# Patient Record
Sex: Male | Born: 1955 | Race: White | Hispanic: No | Marital: Married | Smoking: Former smoker
Health system: Southern US, Community
[De-identification: ages and names within clinical notes are randomized; demographics above are authoritative.]

## PROBLEM LIST (undated history)

## (undated) DIAGNOSIS — I10 Essential (primary) hypertension: Secondary | ICD-10-CM

## (undated) DIAGNOSIS — E785 Hyperlipidemia, unspecified: Secondary | ICD-10-CM

## (undated) DIAGNOSIS — T7840XA Allergy, unspecified, initial encounter: Secondary | ICD-10-CM

## (undated) DIAGNOSIS — F411 Generalized anxiety disorder: Secondary | ICD-10-CM

## (undated) DIAGNOSIS — G4730 Sleep apnea, unspecified: Secondary | ICD-10-CM

## (undated) DIAGNOSIS — K219 Gastro-esophageal reflux disease without esophagitis: Secondary | ICD-10-CM

## (undated) DIAGNOSIS — Q282 Arteriovenous malformation of cerebral vessels: Secondary | ICD-10-CM

## (undated) HISTORY — DX: Gastro-esophageal reflux disease without esophagitis: K21.9

## (undated) HISTORY — DX: Hyperlipidemia, unspecified: E78.5

## (undated) HISTORY — DX: Generalized anxiety disorder: F41.1

## (undated) HISTORY — DX: Allergy, unspecified, initial encounter: T78.40XA

## (undated) HISTORY — DX: Essential (primary) hypertension: I10

## (undated) HISTORY — DX: Sleep apnea, unspecified: G47.30

## (undated) HISTORY — PX: WISDOM TOOTH EXTRACTION: SHX21

---

## 1996-09-03 HISTORY — PX: HEMORRHOID SURGERY: SHX153

## 1998-12-21 ENCOUNTER — Encounter: Payer: Self-pay | Admitting: Emergency Medicine

## 1998-12-21 ENCOUNTER — Emergency Department (HOSPITAL_COMMUNITY): Admission: EM | Admit: 1998-12-21 | Discharge: 1998-12-21 | Payer: Self-pay | Admitting: Emergency Medicine

## 2000-02-01 ENCOUNTER — Ambulatory Visit (HOSPITAL_COMMUNITY): Admission: RE | Admit: 2000-02-01 | Discharge: 2000-02-01 | Payer: Self-pay | Admitting: Internal Medicine

## 2000-02-01 ENCOUNTER — Encounter: Payer: Self-pay | Admitting: Internal Medicine

## 2004-10-20 ENCOUNTER — Ambulatory Visit: Payer: Self-pay | Admitting: Internal Medicine

## 2004-10-27 ENCOUNTER — Ambulatory Visit: Payer: Self-pay | Admitting: Internal Medicine

## 2004-11-07 ENCOUNTER — Ambulatory Visit: Payer: Self-pay | Admitting: Gastroenterology

## 2004-11-22 ENCOUNTER — Ambulatory Visit: Payer: Self-pay | Admitting: Gastroenterology

## 2004-11-22 HISTORY — PX: COLONOSCOPY: SHX174

## 2004-11-22 LAB — HM COLONOSCOPY

## 2005-01-23 ENCOUNTER — Ambulatory Visit: Payer: Self-pay | Admitting: Gastroenterology

## 2005-01-30 ENCOUNTER — Ambulatory Visit: Payer: Self-pay | Admitting: Gastroenterology

## 2005-04-09 ENCOUNTER — Ambulatory Visit: Payer: Self-pay | Admitting: Internal Medicine

## 2005-09-18 ENCOUNTER — Ambulatory Visit: Payer: Self-pay | Admitting: Internal Medicine

## 2005-10-11 ENCOUNTER — Ambulatory Visit: Payer: Self-pay | Admitting: Internal Medicine

## 2005-10-23 ENCOUNTER — Ambulatory Visit: Payer: Self-pay | Admitting: Internal Medicine

## 2005-11-01 ENCOUNTER — Ambulatory Visit: Payer: Self-pay | Admitting: Internal Medicine

## 2005-12-20 ENCOUNTER — Ambulatory Visit: Payer: Self-pay | Admitting: Endocrinology

## 2006-07-24 ENCOUNTER — Ambulatory Visit: Payer: Self-pay | Admitting: Internal Medicine

## 2006-08-02 ENCOUNTER — Ambulatory Visit: Payer: Self-pay | Admitting: Internal Medicine

## 2006-10-22 ENCOUNTER — Ambulatory Visit: Payer: Self-pay | Admitting: Internal Medicine

## 2006-11-11 ENCOUNTER — Ambulatory Visit: Payer: Self-pay | Admitting: Internal Medicine

## 2007-05-08 DIAGNOSIS — E785 Hyperlipidemia, unspecified: Secondary | ICD-10-CM

## 2007-05-08 DIAGNOSIS — K219 Gastro-esophageal reflux disease without esophagitis: Secondary | ICD-10-CM

## 2007-05-08 DIAGNOSIS — I10 Essential (primary) hypertension: Secondary | ICD-10-CM | POA: Insufficient documentation

## 2007-07-03 ENCOUNTER — Ambulatory Visit: Payer: Self-pay | Admitting: Internal Medicine

## 2007-09-18 ENCOUNTER — Ambulatory Visit: Payer: Self-pay | Admitting: Internal Medicine

## 2007-09-18 LAB — CONVERTED CEMR LAB
ALT: 50 units/L (ref 0–53)
AST: 30 units/L (ref 0–37)
BUN: 13 mg/dL (ref 6–23)
CO2: 33 meq/L — ABNORMAL HIGH (ref 19–32)
Calcium: 9.3 mg/dL (ref 8.4–10.5)
Chloride: 102 meq/L (ref 96–112)
Cholesterol: 193 mg/dL (ref 0–200)
Creatinine, Ser: 0.9 mg/dL (ref 0.4–1.5)
GFR calc Af Amer: 115 mL/min
GFR calc non Af Amer: 95 mL/min
Glucose, Bld: 108 mg/dL — ABNORMAL HIGH (ref 70–99)
HDL: 42.8 mg/dL (ref >39.0)
LDL Cholesterol: 123 mg/dL — ABNORMAL HIGH (ref 0–99)
Potassium: 3.9 meq/L (ref 3.5–5.1)
Sodium: 143 meq/L (ref 135–145)
Total CHOL/HDL Ratio: 4.5
Triglycerides: 134 mg/dL (ref 0–149)
VLDL: 27 mg/dL (ref 0–40)

## 2007-09-19 ENCOUNTER — Encounter: Payer: Self-pay | Admitting: Internal Medicine

## 2008-06-07 ENCOUNTER — Telehealth: Payer: Self-pay | Admitting: Internal Medicine

## 2008-06-10 ENCOUNTER — Encounter: Payer: Self-pay | Admitting: Internal Medicine

## 2008-08-02 ENCOUNTER — Ambulatory Visit: Payer: Self-pay | Admitting: Internal Medicine

## 2008-08-02 DIAGNOSIS — F411 Generalized anxiety disorder: Secondary | ICD-10-CM | POA: Insufficient documentation

## 2008-11-26 ENCOUNTER — Encounter: Payer: Self-pay | Admitting: Internal Medicine

## 2009-06-08 ENCOUNTER — Telehealth: Payer: Self-pay | Admitting: Internal Medicine

## 2009-09-12 ENCOUNTER — Telehealth: Payer: Self-pay | Admitting: Internal Medicine

## 2009-10-06 ENCOUNTER — Ambulatory Visit: Payer: Self-pay | Admitting: Internal Medicine

## 2009-10-06 LAB — CONVERTED CEMR LAB
ALT: 39 units/L (ref 0–53)
AST: 24 units/L (ref 0–37)
Albumin: 4.2 g/dL (ref 3.5–5.2)
Alkaline Phosphatase: 55 units/L (ref 39–117)
Bilirubin, Direct: 0.1 mg/dL (ref 0.0–0.3)
Total Bilirubin: 0.6 mg/dL (ref 0.3–1.2)
Total Protein: 7.2 g/dL (ref 6.0–8.3)

## 2009-10-10 ENCOUNTER — Encounter: Admission: RE | Admit: 2009-10-10 | Discharge: 2009-10-10 | Payer: Self-pay | Admitting: Internal Medicine

## 2009-10-11 ENCOUNTER — Ambulatory Visit: Payer: Self-pay | Admitting: Gastroenterology

## 2009-10-24 ENCOUNTER — Ambulatory Visit (HOSPITAL_COMMUNITY): Admission: RE | Admit: 2009-10-24 | Discharge: 2009-10-24 | Payer: Self-pay | Admitting: Gastroenterology

## 2009-10-28 ENCOUNTER — Ambulatory Visit: Payer: Self-pay | Admitting: Gastroenterology

## 2010-01-31 ENCOUNTER — Ambulatory Visit: Payer: Self-pay | Admitting: Internal Medicine

## 2010-06-02 ENCOUNTER — Telehealth: Payer: Self-pay | Admitting: Internal Medicine

## 2010-06-02 ENCOUNTER — Ambulatory Visit: Payer: Self-pay | Admitting: Internal Medicine

## 2010-06-02 LAB — CONVERTED CEMR LAB
Bilirubin Urine: NEGATIVE
Hemoglobin, Urine: NEGATIVE
Ketones, ur: NEGATIVE mg/dL
Leukocytes, UA: NEGATIVE
Nitrite: NEGATIVE
Specific Gravity, Urine: 1.015 (ref 1.000–1.030)
Total Protein, Urine: NEGATIVE mg/dL
Urine Glucose: NEGATIVE mg/dL
Urobilinogen, UA: 0.2 (ref 0.0–1.0)
pH: 6.5 (ref 5.0–8.0)

## 2010-06-05 ENCOUNTER — Ambulatory Visit: Payer: Self-pay | Admitting: Internal Medicine

## 2010-06-05 DIAGNOSIS — R1011 Right upper quadrant pain: Secondary | ICD-10-CM | POA: Insufficient documentation

## 2010-10-03 NOTE — Progress Notes (Signed)
  Phone Note Refill Request Message from:  Fax from Pharmacy on September 12, 2009 9:44 AM  Refills Requested: Medication #1:  OMEPRAZOLE 20 MG CPDR Take 2 tablet by mouth every morning  Medication #2:  DIOVAN HCT 80-12.5 MG  TABS Take 1 tablet by mouth once a day Initial call taken by: Ami Bullins CMA,  September 12, 2009 9:45 AM    Prescriptions: DIOVAN HCT 80-12.5 MG  TABS (VALSARTAN-HYDROCHLOROTHIAZIDE) Take 1 tablet by mouth once a day  #90 x 3   Entered by:   Ami Bullins CMA   Authorized by:   Jacques Navy MD   Signed by:   Bill Salinas CMA on 09/12/2009   Method used:   Electronically to        [facility]* (mail-order)             ,          Ph: [phone]       Fax: [phone]   RxID:   [id number] OMEPRAZOLE 20 MG CPDR (OMEPRAZOLE) Take 2 tablet by mouth every morning  #180 x 3   Entered by:   Ami Bullins CMA   Authorized by:   Jacques Navy MD   Signed by:   Bill Salinas CMA on 09/12/2009   Method used:   Electronically to        [facility]* (mail-order)             ,          Ph: [phone]       Fax: [phone]   RxID:   [id number]

## 2010-10-03 NOTE — Procedures (Signed)
Summary: EGD   EGD  Procedure date:  11/22/2004  Findings:      Location: [facility]   Patient Name: Michael Shepard, Michael Shepard. MRN:  Procedure Procedures: Panendoscopy (EGD) CPT: 43235.    with biopsy(s)/brushing(s). CPT: D1846139.  Personnel: Endoscopist: Vania Rea. Jarold Motto, MD.  Exam Location: Exam performed in Outpatient Clinic. Outpatient  Patient Consent: Procedure, Alternatives, Risks and Benefits discussed, consent obtained, from patient. Consent was obtained by the RN.  Indications Symptoms: Reflux symptoms for 6-10 yrs, occurring 3-6 times/wk.  History  Current Medications: Patient is not currently taking Coumadin.  Pre-Exam Physical: Performed Nov 22, 2004  Cardio-pulmonary exam, Abdominal exam, Extremity exam, Mental status exam WNL.  Exam Exam Info: Maximum depth of insertion Duodenum, intended Duodenum. Patient position: on left side. Duration of exam: 15 minutes. Vocal cords visualized. Gastric retroflexion performed. Images taken. ASA Classification: I. Tolerance: fair, adequate exam.  Sedation Meds: Patient assessed and found to be appropriate for moderate (conscious) sedation. Cetacaine Spray 2 sprays given aerosolized.  Monitoring: BP and pulse monitoring done. Oximetry used. Supplemental O2 given at 2 Liters.  Instrument(s): GIF 160. Serial [id number].   Comments: Severe gagging and retching throughout the procedure. Findings - HIATAL HERNIA: Prolapsing, 10 cms. in length. ICD9: Hernia, Hiatal: 553.3.Comments: Very large hernia and linear erosions in the hernia sac noted. .  - OTHER FINDING: in Distal Esophagus. Biopsy/Other Finding taken. Comments: Slightly irregular Z-line biopsied..R/O Barrett's mucosa.  - Normal: Body to Duodenal 2nd Portion. Not Seen: Mucosal abnormality. AVM's. Foreign body. Comments: Very large prominant rugal folds noted.   Assessment  Diagnoses: 553.3: Hernia, Hiatal.   Comments: Chronic GERD.Marland KitchenMarland KitchenR/O  Barrett's mucosa.Marland KitchenMarland KitchenVery Large HH noted !!!!!! Events  Unplanned Intervention: No unplanned interventions were required.  Plans Medication(s): Await pathology. Continue current medications.  Patient Education: Patient given standard instructions for: Hiatal Hernia. Reflux.  Disposition: After procedure patient sent to recovery. After recovery patient sent home.  Scheduling: Clinic Visit, to Vania Rea. Jarold Motto, MD, around Dec 23, 2004.    cc: Jefferey Pica, MD  This report was created from the original endoscopy report, which was reviewed and signed by the above listed endoscopist.

## 2010-10-03 NOTE — Assessment & Plan Note (Signed)
Summary: Follow up/dfs   History of Present Illness Visit Type: Follow-up Visit Primary GI MD: Sheryn Bison MD FACP FAGA Primary Provider: Jacques Navy MD Requesting Provider: n/a Chief Complaint: follow up RUQ pain, pt states pain is 90% better History of Present Illness:    His CCK/HIDA scan was normal. He does have known gallbladder polyps. He currently is asymptomatic. He specifically denies bowel or irregularity, reflux symptoms, anorexia, systemic complaints, or any hepatobiliary problems   GI Review of Systems    Reports abdominal pain.     Location of  Abdominal pain: RUQ.    Denies acid reflux, belching, bloating, chest pain, dysphagia with liquids, dysphagia with solids, heartburn, loss of appetite, nausea, vomiting, vomiting blood, weight loss, and  weight gain.        Denies anal fissure, black tarry stools, change in bowel habit, constipation, diarrhea, diverticulosis, fecal incontinence, heme positive stool, hemorrhoids, irritable bowel syndrome, jaundice, light color stool, liver problems, rectal bleeding, and  rectal pain.    Current Medications (verified): 1)  Omeprazole 20 Mg Cpdr (Omeprazole) .... Take 2 Tablet By Mouth Every Morning 2)  Simvastatin 40 Mg Tabs (Simvastatin) .... Take 1 Tablet By Mouth Every Morning 3)  Diovan Hct 80-12.5 Mg  Tabs (Valsartan-Hydrochlorothiazide) .... Take 1 Tablet By Mouth Once A Day  Allergies (verified): 1)  ! Penicillin 2)  ! Erythromycin  Past History:  Past medical, surgical, family and social histories (including risk factors) reviewed for relevance to current acute and chronic problems.  Past Medical History: Reviewed history from 08/02/2008 and no changes required.  GENERALIZED ANXIETY DISORDER (ICD-300.02) FAMILY HISTORY DIABETES 1ST DEGREE RELATIVE (ICD-V18.0) HYPERLIPIDEMIA (ICD-272.4) HYPERTENSION (ICD-401.9) GERD (ICD-530.81)  Past Surgical History: Reviewed history from 05/08/2007 and no changes  required. Hemorrhoidectomy-1998 Wisdom tooth extraction- as child  Family History: Reviewed history from 08/02/2008 and no changes required. father- deceased @70 : c/o DM mother - 1935: generally good health. Survived ovarian cancer with surgical cure Family History Diabetes 1st degree relative Neg - prostate or colon cancer MGF - CAD/MI  Social History: Reviewed history from 08/02/2008 and no changes required. 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  Review of Systems  The patient denies allergy/sinus, anemia, anxiety-new, arthritis/joint pain, back pain, blood in urine, breast changes/lumps, confusion, cough, coughing up blood, depression-new, fainting, fatigue, fever, headaches-new, hearing problems, heart murmur, heart rhythm changes, itching, muscle pains/cramps, night sweats, nosebleeds, shortness of breath, skin rash, sleeping problems, sore throat, swelling of feet/legs, swollen lymph glands, thirst - excessive, urination - excessive, urination changes/pain, urine leakage, vision changes, and voice change.    Vital Signs:  Patient profile:   55 year old male Height:      70.5 inches Weight:      180 pounds BMI:     25.55 Pulse rate:   76 / minute Pulse rhythm:   regular BP sitting:   130 / 80  (left arm) Cuff size:   regular  Vitals Entered By: Francee Piccolo CMA Duncan Dull) (October 28, 2009 8:44 AM)  Physical Exam  General:  Well developed, well nourished, no acute distress.healthy appearing.   Head:  Normocephalic and atraumatic. Eyes:  PERRLA, no icterus.exam deferred to patient's ophthalmologist.   Abdomen:  Soft, nontender and nondistended. No masses, hepatosplenomegaly or hernias noted. Normal bowel sounds. Psych:  Alert and cooperative. Normal mood and affect.   Impression & Recommendations:  Problem # 1:  ABDOMINAL PAIN,  RIGHT UPPER QUADRANT (ICD-789.01) Assessment  Improved Probable micro-lithiasis of his gallbladder despite negative workup to date. We will follow him symptomatically and see him acutely she have recurrence of his pain. His liver function tests were normal and previous colonoscopy was unremarkable. We will continue his reflux regime and daily omeprazole for his history of GERD.  Problem # 2:  HYPERTENSION (ICD-401.9) Assessment: Improved blood pressure today is 130/80 and advised to continue his Diovan/HCT and simvastatin.  Patient Instructions: 1)  Copy sent to : Dr. Illene Regulus 2)  Please continue current medications.  3)  Please schedule a follow-up appointment as needed.  4)  The medication list was reviewed and reconciled.  All changed / newly prescribed medications were explained.  A complete medication list was provided to the patient / caregiver.

## 2010-10-03 NOTE — Progress Notes (Signed)
Summary: RX? - U/A   Phone Note Call from Patient Call back at Home Phone [phone]   Summary of Call: Pt was treated in May for UTI. He is c/o same symptoms and "kidney" pain. Will MD call in rx? Or does he need office visit?  Initial call taken by: Lamar Sprinkles, CMA,  June 02, 2010 10:14 AM  Follow-up for Phone Call        Per MD, Pt needs u/a. Pt informed, Order in idx. HOLD PHONE NOTE OPEN TO WAIT ON RESULTS.  Follow-up by: Lamar Sprinkles, CMA,  June 02, 2010 12:45 PM  Additional Follow-up for Phone Call Additional follow up Details #1::        Results ready in EMR Additional Follow-up by: Lamar Sprinkles, CMA,  June 02, 2010 3:32 PM    Additional Follow-up for Phone Call Additional follow up Details #2::    U/A negative - no evidence of infection: no antibiotics needed.  Follow-up by: Jacques Navy MD,  June 02, 2010 4:00 PM  Additional Follow-up for Phone Call Additional follow up Details #3:: Details for Additional Follow-up Action Taken: Left detailed vm to call for sat clinic office visit if continues to have symptoms - u/a neg Additional Follow-up by: Lamar Sprinkles, CMA,  June 02, 2010 4:52 PM

## 2010-10-03 NOTE — Assessment & Plan Note (Signed)
Summary: kidney pain-lb   Vital Signs:  Patient profile:   55 year old male Height:      70.5 inches Weight:      177 pounds BMI:     25.13 O2 Sat:      97 % on Room air Temp:     97.0 degrees F oral Pulse rate:   72 / minute BP sitting:   120 / 64  (left arm) Cuff size:   regular  Vitals Entered By: Bill Salinas CMA (June 05, 2010 9:07 AM)  O2 Flow:  Room air CC: pt here for evaluation of previous symptoms of urinary discomfort and abd pain. Pt states symptoms started last week and have since resolved/ ab Comments PT will get flu shot today and is due tor tetanus/ ab   Primary Care Provider:  Jacques Navy MD  CC:  pt here for evaluation of previous symptoms of urinary discomfort and abd pain. Pt states symptoms started last week and have since resolved/ ab.  History of Present Illness: Patient presents after developing lower abdominal/suprapubic pain last week along with bladder pressure, left flank pain and perineal pain. On further questioning he does admit to perineal pain. He was concerned for bladder infection - U/A in the office last Friday was negative. He reports that his symptoms resolved so that Saturday he was pain free.  He has had intermittent RUQ pain. He had a full work up in February '11 with U/S revealing gallbladder polyps; hepato-biliary scan was normal.  He reports that he is otherwise feeling better than he did 10 years ago.   Current Medications (verified): 1)  Omeprazole 20 Mg Cpdr (Omeprazole) .... Take 2 Tablet By Mouth Every Morning 2)  Simvastatin 40 Mg Tabs (Simvastatin) .... Take 1 Tablet By Mouth Every Morning 3)  Diovan Hct 80-12.5 Mg  Tabs (Valsartan-Hydrochlorothiazide) .... Take 1 Tablet By Mouth Once A Day  Allergies (verified): 1)  ! Penicillin 2)  ! Erythromycin  Past History:  Past Medical History: Last updated: 09/01/2008  GENERALIZED ANXIETY DISORDER (ICD-300.02) FAMILY HISTORY DIABETES 1ST DEGREE RELATIVE  (ICD-V18.0) HYPERLIPIDEMIA (ICD-272.4) HYPERTENSION (ICD-401.9) GERD (ICD-530.81)  Past Surgical History: Last updated: 05/08/2007 Hemorrhoidectomy-1998 Wisdom tooth extraction- as child  Family History: Last updated: 2008/09/01 father- deceased @70 : c/o DM mother - 1935: generally good health. 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  Review of Systems  The patient denies anorexia, fever, weight loss, weight gain, vision loss, decreased hearing, chest pain, dyspnea on exertion, peripheral edema, abdominal pain, severe indigestion/heartburn, muscle weakness, suspicious skin lesions, depression, abnormal bleeding, and enlarged lymph nodes.    Physical Exam  General:  Well-developed,well-nourished,in no acute distress; alert,appropriate and cooperative throughout examination Head:  normocephalic and atraumatic.  normocephalic and atraumatic.   Eyes:  vision grossly intact, pupils equal, and pupils round.  vision grossly intact, pupils equal, and pupils round.   Neck:  supple.  supple.   Lungs:  normal respiratory effort and normal breath sounds.  normal respiratory effort and normal breath sounds.   Heart:  normal rate and regular rhythm.  normal rate and regular rhythm.   Abdomen:  non-tender.  non-tender.   Pulses:  2+ radial Neurologic:  alert & oriented X3, cranial nerves II-XII intact, and gait  normal.  alert & oriented X3, cranial nerves II-XII intact, and gait normal.   Skin:  turgor normal and color normal.  turgor normal and color normal.   Psych:  Oriented X3, normally interactive, good eye contact, and not anxious appearing.  Oriented X3, normally interactive, good eye contact, and not anxious appearing.      Impression & Recommendations:  Problem # 1:  HYPERTENSION (ICD-401.9)  His updated medication list for this problem includes:    Diovan Hct 80-12.5 Mg Tabs (Valsartan-hydrochlorothiazide) .Marland Kitchen... Take 1 tablet by mouth once a day  BP today: 120/64 Prior BP: 134/72 (01/31/2010)  Labs Reviewed: K+: 3.9 (09/18/2007) Creat: : 0.9 (09/18/2007)   Chol: 193 (09/18/2007)   HDL: 42.8 (09/18/2007)   LDL: 123 (09/18/2007)   TG: 134 (09/18/2007)  Great control. continue present meds.  Problem # 2:  PROSTATITIS, MILD (ICD-601.9) Patients symptoms are suggestive of mild prostatitis of viral origin with spontaneous resolution.  Problem # 3:  ABDOMINAL PAIN, RIGHT UPPER QUADRANT (ICD-789.01) continued intermittent pain. suspect that he may of intermittent Cystic duct obstruction from polyp seen on u/s.  Plan - continued observation. For persistent problems may need to consider surgical consult.   Complete Medication List: 1)  Omeprazole 20 Mg Cpdr (Omeprazole) .... Take 2 tablet by mouth every morning 2)  Simvastatin 40 Mg Tabs (Simvastatin) .... Take 1 tablet by mouth every morning 3)  Diovan Hct 80-12.5 Mg Tabs (Valsartan-hydrochlorothiazide) .... Take 1 tablet by mouth once a day  Other Orders: Admin 1st Vaccine (16109) Flu Vaccine 86yrs + (60454) Benadryl  IM or IV (J1200) Admin of Therapeutic Inj  intramuscular or subcutaneous (09811) Flu Vaccine Consent Questions     Do you have a history of severe allergic reactions to this vaccine? no    Any prior history of allergic reactions to egg and/or gelatin? no    Do you have a sensitivity to the preservative Thimersol? no    Do you have a past history of Guillan-Barre Syndrome? no    Do you currently have an acute febrile illness? no    Have you ever had a severe reaction to latex? no    Vaccine information given and explained to patient? yes    Are you currently pregnant? no    Lot Number:[id number]   Exp Date:03/03/2011   Site Given   Left Deltoid IM   Medication Administration  Injection # 1:    Medication: Benadryl  IM or IV    Diagnosis: ROUTINE GENERAL MEDICAL EXAM@HEALTH  CARE FACL (ICD-V70.0)    Route: IM    Site: R deltoid    Exp Date: 10/13    Lot #: [id number]    Mfr: GlaxoSmithKline  Orders Added: 1)  Admin 1st Vaccine [90471] 2)  Flu Vaccine 63yrs + [13086] 3)  Benadryl  IM or IV [J1200] 4)  Admin of Therapeutic Inj  intramuscular or subcutaneous [96372] 5)  Est. Patient Level III [57846] .lbflu    Appended Document: kidney pain-lb     Clinical Lists Changes  Orders: Added new Service order of Admin 1st Vaccine (96295) - Signed Added new Service order of Flu Vaccine 55yrs + [phone]) - Signed Observations: Added new observation of FLU VAX VIS: 03/28/10 version (06/05/2010 10:46) Added new observation of FLU VAXLOT: [id number] (06/05/2010 10:46) Added new observation of FLU VAXMFR: Glaxosmithkline (06/05/2010 10:46) Added new observation of FLU VAX EXP: 03/03/2011 (06/05/2010 10:46) Added new observation of FLU VAX DSE: 0.23ml (06/05/2010 10:46) Added new observation of FLU VAX:  Fluvax 3+ (06/05/2010 10:46)Flu Vaccine Consent Questions     Do you have a history of severe allergic reactions to this vaccine? no    Any prior history of allergic reactions to egg and/or gelatin? no    Do you have a sensitivity to the preservative Thimersol? no    Do you have a past history of Guillan-Barre Syndrome? no    Do you currently have an acute febrile illness? no    Have you ever had a severe reaction to latex? no    Vaccine information given and explained to patient? yes    Are you currently pregnant? no    Lot Number:[id number]   Exp Date:03/03/2011   Site Given  Left Deltoid IMw observation of FLU VAX EXP: 03/03/2011 (06/05/2010 10:46) Added new observation of FLU VAX DSE: 0.25ml (06/05/2010 10:46) Added new observation of FLU VAX: Fluvax 3+ (06/05/2010 10:46)    .lbflu

## 2010-10-03 NOTE — Procedures (Signed)
Summary: COLON   Colonoscopy  Procedure date:  11/22/2004  Findings:      Location:  [facility].   Patient Name: Michael Shepard, Michael Shepard MRN:  Procedure Procedures: Colonoscopy CPT: 09323.  Personnel: Endoscopist: Vania Rea. Jarold Motto, MD.  Exam Location: Exam performed in Outpatient Clinic. Outpatient  Patient Consent: Procedure, Alternatives, Risks and Benefits discussed, consent obtained, from patient. Consent was obtained by the RN.  Indications Symptoms: Abdominal pain / bloating.  History  Current Medications: Patient is not currently taking Coumadin.  Pre-Exam Physical: Performed Nov 22, 2004. Cardio-pulmonary exam, Rectal exam, Abdominal exam, Extremity exam, Mental status exam WNL.  Exam Exam: Extent of exam reached: Cecum, extent intended: Cecum.  The cecum was identified by appendiceal orifice and IC valve. Patient position: on left side. Duration of exam: 20 minutes. Colon retroflexion performed. Images taken. ASA Classification: I. Tolerance: excellent.  Monitoring: Pulse and BP monitoring, Oximetry used. Supplemental O2 given. at 2 Liters.  Colon Prep Used Golytely for colon prep. Prep results: excellent.  Sedation Meds: Patient assessed and found to be appropriate for moderate (conscious) sedation. Fentanyl 50 mcg. given IV. Versed 5 mg. given IV.  Instrument(s): CF 140L. Serial [id number].  Findings - DIVERTICULOSIS: Descending Colon to Sigmoid Colon. Not bleeding. ICD9: Diverticulosis, Colon: 562.10. Comments: A few scattered tics noted...no inflammation noted.  - NORMAL EXAM: Cecum to Rectum. Not Seen: Polyps. AVM's. Colitis. Tumors. Crohn's.   Assessment  Diagnoses: 562.10: Diverticulosis, Colon.   Events  Unplanned Interventions: No intervention was required.  Plans Medication Plan: Fiber supplements: Methylcellulose 1 tsp QAM, starting Nov 22, 2004 for indefinitely.   Patient Education: Patient given standard  instructions for: Diverticulosis. high fiber diet.  Disposition: After procedure patient sent to recovery. After recovery patient sent home.  Scheduling/Referral: Follow-Up prn.    cc: Jefferey Pica, MD  This report was created from the original endoscopy report, which was reviewed and signed by the above listed endoscopist.

## 2010-10-03 NOTE — Assessment & Plan Note (Signed)
Summary: STOMACH PAIN FOR 3 WKS /NWS   Vital Signs:  Patient profile:   55 year old male Height:      71 inches Weight:      177 pounds BMI:     24.78 O2 Sat:      97 % on Room air Temp:     98.0 degrees F oral Pulse rate:   95 / minute BP sitting:   142 / 68  (left arm) Cuff size:   regular  Vitals Entered By: Bill Salinas CMA (October 06, 2009 9:10 AM)  O2 Flow:  Room air CC: pt here with c/o abd pain x 3 weeks with occasional diarrhea. Pt states pain is mainly right under his rib cage/ pt denies any blood in stools and states last colonoscopy was in 2007/ ab   Primary Care Provider:  Jacques Navy MD  CC:  pt here with c/o abd pain x 3 weeks with occasional diarrhea. Pt states pain is mainly right under his rib cage/ pt denies any blood in stools and states last colonoscopy was in 2007/ ab.  History of Present Illness: 3 week h/o RUQ abdominal pain that is a clenched knife type pain. No radiation to the back. No relationship to foods. No change in bowel habit - intermittent diarrhea tht has been chronic. No light colored stools. No urination problems. No fevers or chills. Occasional mild nausea.   Old records searched ( see scanned not from '07): last abdominal U/S with gallbladder polyp but no other abnormality  Current Medications (verified): 1)  Omeprazole 20 Mg Cpdr (Omeprazole) .... Take 2 Tablet By Mouth Every Morning 2)  Simvastatin 40 Mg Tabs (Simvastatin) .... Take 1 Tablet By Mouth Every Morning 3)  Diovan Hct 80-12.5 Mg  Tabs (Valsartan-Hydrochlorothiazide) .... Take 1 Tablet By Mouth Once A Day  Allergies (verified): 1)  ! Penicillin 2)  ! Erythromycin  Past History:  Past Medical History: Last updated: 2008/08/27  GENERALIZED ANXIETY DISORDER (ICD-300.02) FAMILY HISTORY DIABETES 1ST DEGREE RELATIVE (ICD-V18.0) HYPERLIPIDEMIA (ICD-272.4) HYPERTENSION (ICD-401.9) GERD (ICD-530.81)  Past Surgical History: Last updated:  05/08/2007 Hemorrhoidectomy-1998 Wisdom tooth extraction- as child  Family History: Last updated: Aug 27, 2008 father- deceased @70 : c/o DM mother - 1935: generally good health. 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  Risk Factors: Alcohol Use: 1 (2008-08-27) Caffeine Use: 2 (08/27/2008) Exercise: yes (Aug 27, 2008)  Risk Factors: Smoking Status: quit (08/27/2008) Passive Smoke Exposure: no (27-Aug-2008)  Review of Systems  The patient denies anorexia, fever, weight loss, weight gain, hoarseness, chest pain, prolonged cough, abdominal pain, severe indigestion/heartburn, genital sores, transient blindness, difficulty walking, unusual weight change, and enlarged lymph nodes.    Physical Exam  General:  WNWD white male in no distress Head:  Normocephalic and atraumatic without obvious abnormalities. No apparent alopecia or balding. Eyes:  sclera clear Neck:  supple and full ROM.   Lungs:  normal respiratory effort and normal breath sounds.   Heart:  normal rate and regular rhythm.   Abdomen:  soft, non-tender, normal bowel sounds, no distention, no masses, no guarding, no rigidity, and no hepatomegaly.  No tenderness to deep palpation  RUQ, no GB bulb Neurologic:  alert & oriented X3, cranial nerves II-XII intact, strength normal in all extremities, and gait  normal.   Skin:  turgor normal, color normal, and no rashes.   Psych:  Oriented X3 and good eye contact.     Impression & Recommendations:  Problem # 1:  ABDOMINAL PAIN, RIGHT UPPER QUADRANT (ICD-789.01) Recurrent problem. At last eval no cause found. He is very functional and has no limitations in his activities.  Plan - Abdominal U/S - evaluate for GB  changes or hepatic abnormalities           Lab - LFT's r/o fatty infiltrate liver or other hepatitis.           if lab and imaging are unrevealing will refer to GI  Orders: TLB-Hepatic/Liver Function Pnl ([id number]-[id number]) Radiology Referral (Radiology) Gastroenterology Referral (GI)  addendum - LFT's normal  Complete Medication List: 1)  Omeprazole 20 Mg Cpdr (Omeprazole) .... Take 2 tablet by mouth every morning 2)  Simvastatin 40 Mg Tabs (Simvastatin) .... Take 1 tablet by mouth every morning 3)  Diovan Hct 80-12.5 Mg Tabs (Valsartan-hydrochlorothiazide) .... Take 1 tablet by mouth once a day   Immunization History:  Influenza Immunization History:    Influenza:  historical (07/08/2009)

## 2010-10-03 NOTE — Assessment & Plan Note (Signed)
Summary: PROSTATITIS--STC   Vital Signs:  Patient profile:   55 year old male Height:      70.5 inches Weight:      179 pounds BMI:     25.41 O2 Sat:      98 % on Room air Temp:     97.0 degrees F oral Pulse rate:   76 / minute BP sitting:   134 / 72  (left arm) Cuff size:   regular  Vitals Entered By: Bill Salinas CMA (Jan 31, 2010 9:49 AM)  O2 Flow:  Room air CC: pt here with c/o having burning sensation when urinating but states burning sensation constant just more noticable when he urinates/ ab   Primary Care Provider:  Jacques Navy MD  CC:  pt here with c/o having burning sensation when urinating but states burning sensation constant just more noticable when he urinates/ ab.  History of Present Illness: Having lower abdominal pain/pressure, pressure with urination. He did have a fever that did not last long.  No low back pain. Mild perineal pain. Mild dysuria. No pain with ejactulation. He has had prostatitis in the past ('90's) and this feels the same.   Current Medications (verified): 1)  Omeprazole 20 Mg Cpdr (Omeprazole) .... Take 2 Tablet By Mouth Every Morning 2)  Simvastatin 40 Mg Tabs (Simvastatin) .... Take 1 Tablet By Mouth Every Morning 3)  Diovan Hct 80-12.5 Mg  Tabs (Valsartan-Hydrochlorothiazide) .... Take 1 Tablet By Mouth Once A Day  Allergies (verified): 1)  ! Penicillin 2)  ! Erythromycin  Past History:  Past Medical History: Last updated: 08-16-2008  GENERALIZED ANXIETY DISORDER (ICD-300.02) FAMILY HISTORY DIABETES 1ST DEGREE RELATIVE (ICD-V18.0) HYPERLIPIDEMIA (ICD-272.4) HYPERTENSION (ICD-401.9) GERD (ICD-530.81)  Past Surgical History: Last updated: 05/08/2007 Hemorrhoidectomy-1998 Wisdom tooth extraction- as child  Family History: Last updated: 08/16/08 father- deceased @70 : c/o DM mother - 1935: generally good health. Survived ovarian cancer with surgical cure Family History Diabetes 1st degree relative Neg - prostate or colon  cancer MGF - CAD/MI  Social History: Last updated: 08/16/2008 Gypsy Decant - BA Married '86 1 daughter - stepchild work: Systems developer for [employer] Hobbies: hiking and outdoor activity.  Risk Factors: Alcohol Use: 1 (August 16, 2008) Caffeine Use: 2 (Aug 16, 2008) Exercise: yes (08-16-2008)  Risk Factors: Smoking Status: quit (08-16-2008) Passive Smoke Exposure: no (August 16, 2008)  Review of Systems  The patient denies anorexia, fever, weight loss, weight gain, vision loss, hoarseness, chest pain, dyspnea on exertion, prolonged cough, genital sores, and difficulty walking.    Physical Exam  General:  alert, well-developed, and well-nourished.   Head:  normocephalic and atraumatic.   Eyes:  pupils equal and pupils round.   Neck:  supple and full ROM.   Lungs:  normal respiratory effort and normal breath sounds.   Heart:  normal rate and regular rhythm.   Abdomen:  soft, non-tender, normal bowel sounds, no guarding, and no rigidity.   Rectal:  No external abnormalities noted. Normal sphincter tone. No rectal masses or tenderness. Prostate:  Prostate gland firm and smooth, no enlargement, nodularity, tenderness, mass, asymmetry or induration.   Impression & Recommendations:  Problem # 1:  ACUTE CYSTITIS (ICD-595.0)  symptoms are suggestive of cystitis - mild. His prostate exam was unremarkable.  Plan - TMP/SMX DS two times a day x 5 days  His updated medication list for this problem includes:    Sulfamethoxazole-tmp Ds 800-160 Mg Tabs (Sulfamethoxazole-trimethoprim) .Marland Kitchen... 1 by mouth two times a day for cystitis  Complete Medication List: 1)  Omeprazole 20 Mg Cpdr (Omeprazole) .... Take 2 tablet by mouth every morning 2)  Simvastatin 40 Mg Tabs (Simvastatin) .... Take 1 tablet by mouth every morning 3)  Diovan Hct 80-12.5 Mg Tabs (Valsartan-hydrochlorothiazide) .... Take 1 tablet by mouth once a day 4)  Sulfamethoxazole-tmp Ds 800-160 Mg Tabs (Sulfamethoxazole-trimethoprim)  .Marland Kitchen.. 1 by mouth two times a day for cystitis Prescriptions: SULFAMETHOXAZOLE-TMP DS 800-160 MG TABS (SULFAMETHOXAZOLE-TRIMETHOPRIM) 1 by mouth two times a day for cystitis  #10 x 0   Entered and Authorized by:   Jacques Navy MD   Signed by:   Jacques Navy MD on 01/31/2010   Method used:   Electronically to        [facility] [phone]* (retail)       [address]       [place], [place]  [address]       Ph: [phone]       Fax: [phone]   RxID:   [id number]

## 2010-10-03 NOTE — Assessment & Plan Note (Signed)
Summary: ABD PAIN/YF    History of Present Illness Visit Type: Initial Consult Primary GI MD: Sheryn Bison MD FACP FAGA Primary Provider: Jacques Navy MD Requesting Provider: Illene Regulus, MD Chief Complaint: RUQ pain x 1 month, more severe in evenings History of Present Illness:   this patient is a 55 year old Caucasian male that seen in the past for acid reflux and screening colonoscopy. He presents now with a one-month history of constant right upper quadrant discomfort described as a full without relationship to meals or other maneuvers. He said no nausea vomiting, reflux symptoms or change in bowel habits or any hepatobiliary complaints. He is on daily omeprazole for GERD and also simvastatin for hyperlipidemia and Diovan-HCTZ for hypertension. Ultrasound was reviewed which showed several gallbladder polyps but no definite stones. Liver function tests been normal. Family history is noncontributory. No history of lower GI complaints. Appetite is good and weight is been stable.   GI Review of Systems    Reports abdominal pain, acid reflux, and  belching.     Location of  Abdominal pain: RUQ.    Denies bloating, chest pain, dysphagia with liquids, dysphagia with solids, heartburn, loss of appetite, nausea, vomiting, vomiting blood, weight loss, and  weight gain.      Reports diarrhea.     Denies anal fissure, black tarry stools, change in bowel habit, constipation, diverticulosis, fecal incontinence, heme positive stool, hemorrhoids, irritable bowel syndrome, jaundice, light color stool, liver problems, rectal bleeding, and  rectal pain.    Current Medications (verified): 1)  Omeprazole 20 Mg Cpdr (Omeprazole) .... Take 2 Tablet By Mouth Every Morning 2)  Simvastatin 40 Mg Tabs (Simvastatin) .... Take 1 Tablet By Mouth Every Morning 3)  Diovan Hct 80-12.5 Mg  Tabs (Valsartan-Hydrochlorothiazide) .... Take 1 Tablet By Mouth Once A Day  Allergies (verified): 1)  ! Penicillin 2)   ! Erythromycin  Past History:  Past medical, surgical, family and social histories (including risk factors) reviewed for relevance to current acute and chronic problems.  Past Medical History: Reviewed history from 08/02/2008 and no changes required.  GENERALIZED ANXIETY DISORDER (ICD-300.02) FAMILY HISTORY DIABETES 1ST DEGREE RELATIVE (ICD-V18.0) HYPERLIPIDEMIA (ICD-272.4) HYPERTENSION (ICD-401.9) GERD (ICD-530.81)  Past Surgical History: Reviewed history from 05/08/2007 and no changes required. Hemorrhoidectomy-1998 Wisdom tooth extraction- as child  Family History: Reviewed history from 08/02/2008 and no changes required. father- deceased @70 : c/o DM mother - 1935: generally good health. Survived ovarian cancer with surgical cure Family History Diabetes 1st degree relative Neg - prostate or colon cancer MGF - CAD/MI  Social History: Reviewed history from 08/02/2008 and no changes required. 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  Review of Systems       The patient complains of allergy/sinus.  The patient denies anemia, anxiety-new, arthritis/joint pain, back pain, blood in urine, breast changes/lumps, change in vision, confusion, cough, coughing up blood, depression-new, fainting, fatigue, fever, headaches-new, hearing problems, heart murmur, heart rhythm changes, itching, menstrual pain, muscle pains/cramps, night sweats, nosebleeds, pregnancy symptoms, shortness of breath, skin rash, sleeping problems, sore throat, swelling of feet/legs, swollen lymph glands, thirst - excessive , urination - excessive , urination changes/pain, urine leakage, vision changes, and voice change.   General:  Denies fever, chills, sweats, anorexia, fatigue, weakness, malaise, weight loss, and sleep disorder. Eyes:  Denies blurring, diplopia, irritation, discharge, vision loss, scotoma, eye pain, and  photophobia. ENT:  Denies earache, ear discharge, tinnitus, decreased  hearing, nasal congestion, loss of smell, nosebleeds, sore throat, hoarseness, and difficulty swallowing. CV:  Denies chest pains, angina, palpitations, syncope, dyspnea on exertion, orthopnea, PND, peripheral edema, and claudication. Resp:  Denies dyspnea at rest, dyspnea with exercise, cough, sputum, wheezing, coughing up blood, and pleurisy. GI:  Complains of abdominal pain; denies difficulty swallowing, pain on swallowing, nausea, indigestion/heartburn, vomiting, vomiting blood, jaundice, gas/bloating, diarrhea, constipation, change in bowel habits, bloody BM's, black BMs, and fecal incontinence. GU:  Denies urinary burning, blood in urine, urinary frequency, urinary hesitancy, nocturnal urination, urinary incontinence, penile discharge, genital sores, decreased libido, and erectile dysfunction. MS:  Denies joint pain / LOM, joint swelling, joint stiffness, joint deformity, low back pain, muscle weakness, muscle cramps, muscle atrophy, leg pain at night, leg pain with exertion, and shoulder pain / LOM hand / wrist pain (CTS). Derm:  Denies rash, itching, dry skin, hives, moles, warts, and unhealing ulcers. Neuro:  Denies weakness, paralysis, abnormal sensation, seizures, syncope, tremors, vertigo, transient blindness, frequent falls, frequent headaches, difficulty walking, headache, sciatica, radiculopathy other:, restless legs, memory loss, and confusion. Endo:  Denies cold intolerance, heat intolerance, polydipsia, polyphagia, polyuria, unusual weight change, and hirsutism. Heme:  Denies bruising, bleeding, enlarged lymph nodes, and pagophagia. Allergy:  Complains of hay fever; denies hives, rash, sneezing, and recurrent infections.  Vital Signs:  Patient profile:   55 year old male Height:      70.5 inches Weight:      178.25 pounds BMI:     25.31 Pulse rate:   72 / minute Pulse rhythm:   regular BP sitting:   134 / 70   (left arm) Cuff size:   regular  Vitals Entered By: June McMurray CMA Duncan Dull) (October 11, 2009 1:37 PM)  Physical Exam  General:  Well developed, well nourished, no acute distress.healthy appearing.  I cannot appreciate stigmata of chronic liver disease. Head:  Normocephalic and atraumatic. Eyes:  PERRLA, no icterus. Neck:  Supple; no masses or thyromegaly. Lungs:  Clear throughout to auscultation. Heart:  Regular rate and rhythm; no murmurs, rubs,  or bruits. Abdomen:  Soft, nontender and nondistended. No masses, hepatosplenomegaly or hernias noted. Normal bowel sounds.No CVA tenderness. His liver edges palpable to deep inspiration and it is firm and nontender. His pain is not reproduced with liver palpation. Examination of the rib and chest areas normal. Msk:  Symmetrical with no gross deformities. Normal posture. Pulses:  Normal pulses noted. Extremities:  No clubbing, cyanosis, edema or deformities noted. Neurologic:  Alert and  oriented x4;  grossly normal neurologically. Skin:  Intact without significant lesions or rashes. Cervical Nodes:  No significant cervical adenopathy. Axillary Nodes:  No significant axillary adenopathy. Inguinal Nodes:  No significant inguinal adenopathy. Psych:  Alert and cooperative. Normal mood and affect.   Impression & Recommendations:  Problem # 1:  ABDOMINAL PAIN, RIGHT UPPER QUADRANT (ICD-789.01) Assessment Improved  Probable dysfunctional gallbladder related to rather prominent gallbladder polyps and probable small calculi. I will check CCK/HIDA scan and consider surgical referral.  Orders: HIDA CCK (HIDA CCK)  Problem # 2:  GERD (ICD-530.81) Assessment: Improved Continue reflux regime and daily PPI therapy.  Problem # 3:  GENERALIZED ANXIETY DISORDER (ICD-300.02) Assessment: Unchanged  Problem # 4:  HYPERTENSION (ICD-401.9) Assessment: Improved Blood Pressure Today is 134% of the habitus we continue his Diovan-HCTZ As per Dr.  Debby Bud.  Patient Instructions: 1)  Copy sent to: Dr. Illene Regulus 2)  Please continue current medications.  3)  CCK-HIDA scan 4)  Consider laparoscopic cholecystectomy. 5)  The medication list was reviewed and reconciled.  All changed / newly prescribed medications were explained.  A complete medication list was provided to the patient / caregiver.  Appended Document: ABD PAIN/YF BP comment in problem list is in error per voice recognition...drp

## 2011-01-02 ENCOUNTER — Ambulatory Visit (INDEPENDENT_AMBULATORY_CARE_PROVIDER_SITE_OTHER): Payer: BC Managed Care – PPO | Admitting: Internal Medicine

## 2011-01-02 DIAGNOSIS — R39198 Other difficulties with micturition: Secondary | ICD-10-CM

## 2011-01-02 DIAGNOSIS — R3919 Other difficulties with micturition: Secondary | ICD-10-CM

## 2011-01-02 NOTE — Progress Notes (Signed)
  Subjective:    Patient ID: Michael Shepard, male    DOB: [birth year], 55 y.o.   MRN: [id number]  HPI Michael Shepard presents for evaluation of intermittent episodes of discomfort in the supra-pubic region, post-micturition bladder discomfort, change in urinary flow. He was treated about 1 year ago for prostatitis when he presented with similar symptoms. He reports no back pain, perineal pain, lower quadrant abdominal pain, no fevers or chills, no hematuria. He does have some discomfort with ejactulation.   Past Medical History  Diagnosis Date  . Generalized anxiety disorder   . Other and unspecified hyperlipidemia   . Unspecified essential hypertension   . Esophageal reflux    Past Surgical History  Procedure Date  . Hemorrhoid surgery 1998  . Wisdom tooth extraction    Family History  Problem Relation Age of Onset  . Ovarian cancer Mother   . Diabetes Father   . Coronary artery disease Maternal Grandfather   . Prostate cancer Neg Hx   . Colon cancer Neg Hx   . Diabetes Other    History   Social History  . Marital Status: Legally Separated    Spouse Name: N/A    Number of Children: N/A  . Years of Education: N/A   Occupational History  . Not on file.   Social History Main Topics  . Smoking status: Not on file  . Smokeless tobacco: Not on file  . Alcohol Use:   . Drug Use:   . Sexually Active:    Other Topics Concern  . Not on file   Social History Narrative  . No narrative on file       Review of Systems Review of Systems  Constitutional:  Negative for fever, chills, activity change and unexpected weight change.  HENT:  Negative for hearing loss, ear pain, congestion, neck stiffness and postnasal drip.   Eyes: Negative for pain, discharge and visual disturbance.  Respiratory: Negative for chest tightness and wheezing.   Cardiovascular: Negative for chest pain and palpitations.       [No decreased exercise tolerance Gastrointestinal: [No change in bowel  habit. No bloating or gas. No reflux or indigestion Genitourinary: Negative for frequency, flank pain, hematuria. With episodes of discomfort will come decreased force of stream.  Musculoskeletal: Negative for myalgias, back pain, arthralgias and gait problem.  Neurological: Negative for dizziness, tremors, weakness and headaches.  Hematological: Negative for adenopathy.  Psychiatric/Behavioral: Negative for behavioral problems and dysphoric mood.       Objective:   Physical Exam WNWD white male in no distress. Abdomen - BS+, soft, no guarding or rebound, no tenderness to deep palpation to the lower quadrants or suprapubic region. Prostate exam - mildly enlarged, no nodules or irregularities, no bogginess or tenderness.       Assessment & Plan:  1. 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  Plan - No indication for antibiotics           Trial of Rapaflo - to improve force of stream. If his symptoms persist will refer to Urology for further evaluation.

## 2011-01-19 NOTE — Assessment & Plan Note (Signed)
[facility]                           PRIMARY CARE OFFICE NOTE   NAME:Carlile, EDWORD CU                     MRN:          [id number]  DATE:08/02/2006                            DOB:          07/17/1956    Mr. Michael Shepard is a delightful 55 year old gentleman who presents for  evaluation and physical exam.  Patient reports he has no active  complaints at this time and feels well.  He was last seen in the office  by Dr. Romero Belling, December 20, 2005, for sinus infection.  He was  treated at that time with Zithromax with good results.  He was seen  November 01, 2005 for followup on blood pressure, at which time he was doing  well, tolerating his medications.   PAST MEDICAL HISTORY:  1. Wisdom tooth extraction as a youth.  2. Hemorrhoidectomy in 1998.   MEDICAL ILLNESSES:  1. Childhood illnesses including mumps and chicken pox.  2. Venereal warts.  3. GERD.  4. Hyperlipidemia.   MEDICATIONS:  1. Nexium 40 daily.  2. Vytorin 10/40 once daily.  3. Diovan HCT 80/12.5 daily.  4. Advil on a p.r.n. basis.   FAMILY HISTORY:  Positive for diabetes in his father.  Negative for  coronary artery disease, diabetes, colon cancer.   SOCIAL HISTORY:  Patient worked as a Publishing copy.  Patient is married 21  years.  His wife has fibromyalgia, but otherwise is in good health.   REVIEW OF SYSTEMS:  Negative for any constitutional problems.  He has  had an eye exam in the last 12 months with no abnormalities.  No ENT,  dental, cardiovascular, or respiratory problems.  GI:  Patient's  dyspepsia is controlled on Nexium.  He does have occasional discomfort  and fullness.  No GU or musculoskeletal complaints.   EXAMINATION:  TEMPERATURE:  98  BLOOD PRESSURE:  166/86  PULSE:  108.  GENERAL APPEARANCE:  This is a well-nourished, well-developed gentleman  in no acute distress.  HEENT:  Unremarkable.  Neck was supple without thyromegaly.  No lymphadenopathy was noted in  the  cervical or supraclavicular regions.  CHEST:  No CVA tenderness.  LUNGS:  Clear to auscultation and percussion.  CARDIOVASCULAR:  2+ radial pulse.  No JVD or carotid bruits. He had a  quiet precordium with regular rate and rhythm without murmurs, rubs, or  gallops.  ABDOMEN:  Soft, no guarding, no rebound.  No organosplenomegaly was  appreciated.  GENITALIA:  Normal male.  Bilaterally descended testicles without  masses.  RECTAL:  Normal sphincter tone was noted.  Prostate is smooth, round,  normal in size and contour.  EXTREMITIES:  Without clubbing, cyanosis, edema or deformity.  NEUROLOGIC:  Nonfocal.  SKIN:  Clear.   DATA BASE:  From [facility]:  Cholesterol is 185, triglycerides  176, HDL 47, LDL 103.  Chemistries revealed blood sugar of 88.  Kidney  function normal with a creatinine of 0.9.  Electrolytes were all normal.  Liver functions were normal.  CBC with a white count of 6,400.  Hemoglobin 14.5 gm, hematocrit 42.1%.  Urinalysis was negative.  TSH  was  normal at 2.58.   CHART REVIEW:  Last colonoscopy November 22, 2004, which was unremarkable  except for diverticulosis.  Patient to be a candidate for follow up in  10 years.   EGD November 22, 2004 with a hiatal hernia, changes of chronic GERD;  esophageal biopsy read out as consistent with inflammation, no  intestinal metaplasia, dysplasia, or malignancy was identified.   Stress Cardiolite Study, dates from Jan 03, 1999, was a negative study  with an EF of 65% by estimation.   Last carotid Doppler May 26, 2004, with no evidence of any carotid  obstruction.   Last abdominal ultrasound Jan 30, 2005, with small gallbladder polyp, no  evidence of pancreatic enlargement, cholelithiasis or biliary ductal  dilatation.  Patient did have a CT scan of brain Feb 01, 2000, which was  negative for any abnormalities.   Last electrocardiogram from August 07, 2006 was a normal EKG without  any problems.   ASSESSMENT AND  PLAN:  1. Hypertension.  Patient's blood pressure is elevated at today's      visit, but looking at his chart, he was 129/83 December 20, 2005,      131/84 November 01, 2005, 158/93 September 18, 2005.  Plan:  Patient to      continue his present medications.  2. Hyperlipidemia.  Patient with excellent control on his present      regimen, essentially at goal with an LDL of 103.  Plan is to      continue Vytorin 10/40.  3. Dyspepsia.  Patient is stable with his gastroesophageal reflux      disease.  Continue on Nexium, although I explained if there was      pushback from [employer], we may want to      try him on omeprazole.  4. Health maintenance.  Patient is currently up to date with      colorectal cancer screening and laboratory.   In summary, this is a very pleasant gentleman who seems medically stable  at this time.  I have asked him to continue to monitor his blood  pressure at home and notify the office if his systolics are greater than  140, diastolics greater than 90.  I have asked him to return to see me  on a p.r.n. basis or in 1 year.     Rosalyn Gess Norins, MD  Electronically Signed    MEN/MedQ  DD: 08/04/2006  DT: 08/05/2006  Job #: [id number]   cc:   Sheran Luz

## 2011-01-25 ENCOUNTER — Other Ambulatory Visit: Payer: Self-pay | Admitting: *Deleted

## 2011-01-25 MED ORDER — VALSARTAN-HYDROCHLOROTHIAZIDE 80-12.5 MG PO TABS: 1.0000 | ORAL_TABLET | Freq: Every day | ORAL | Status: DC

## 2011-01-25 MED ORDER — OMEPRAZOLE 20 MG PO CPDR: 40.0000 mg | DELAYED_RELEASE_CAPSULE | Freq: Every day | ORAL | Status: DC

## 2011-01-25 MED ORDER — SIMVASTATIN 40 MG PO TABS: 40.0000 mg | ORAL_TABLET | Freq: Every day | ORAL | Status: DC

## 2011-02-27 ENCOUNTER — Telehealth: Payer: Self-pay | Admitting: *Deleted

## 2011-02-27 DIAGNOSIS — N401 Enlarged prostate with lower urinary tract symptoms: Secondary | ICD-10-CM

## 2011-02-27 NOTE — Telephone Encounter (Signed)
Pt c/o continued urinary problems. He would liked referral to urologist.

## 2011-02-27 NOTE — Telephone Encounter (Signed)
Referral entered  

## 2011-02-27 NOTE — Telephone Encounter (Signed)
Per pt - MD advised him that if symptoms did not improve he would refer to urology.   Copied from last OV notes: Plan - No indication for antibiotics  Trial of Rapaflo - to improve force of stream. If his symptoms persist will refer to Urology for further evaluation.  Would you like to see pt again prior to referral?

## 2011-02-27 NOTE — Telephone Encounter (Signed)
Left pt VM that referral was in process

## 2011-02-27 NOTE — Telephone Encounter (Signed)
Needs ov to determine reason for referral

## 2011-06-06 IMAGING — US US ABDOMEN COMPLETE
1 series · 13 of 25 positions shown · non-contrast
Comparison: None.

CLINICAL DATA: Abdominal pain, particularly in the right upper
quadrant, some diarrhea

COMPLETE ABDOMINAL ULTRASOUND

[Series 1: us abdomen complete · 0.27mm/px · 13 of 97 slices shown]
[im 1/97]
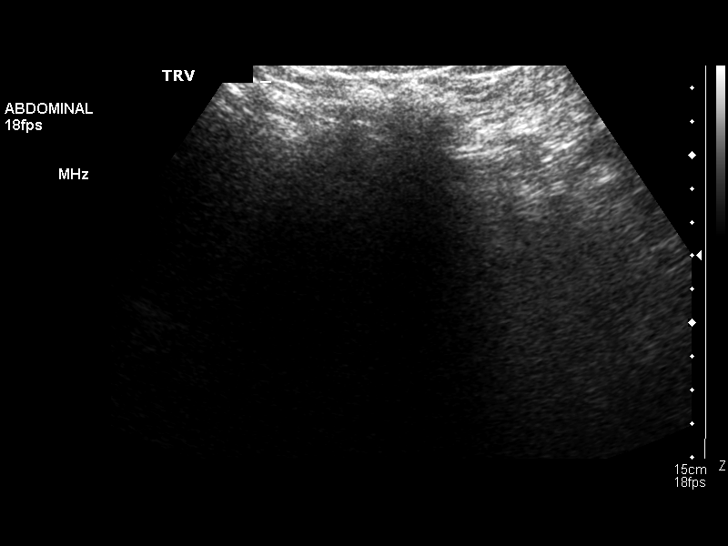
[im 9/97]
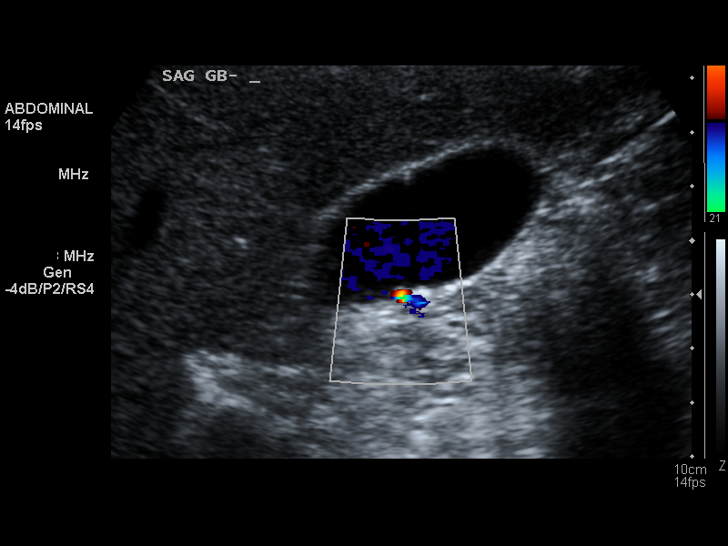
[im 17/97]
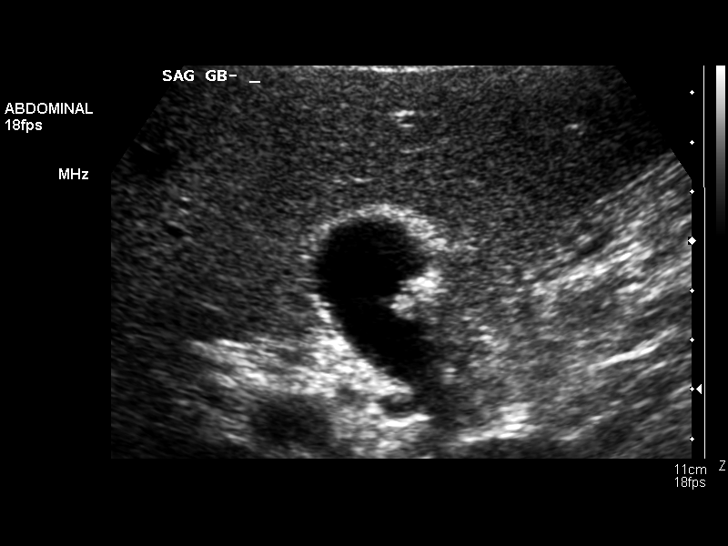
[im 25/97]
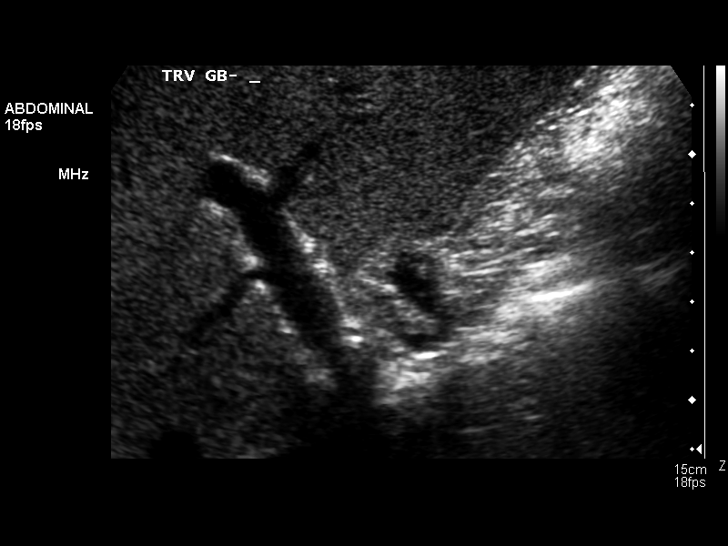
[im 33/97]
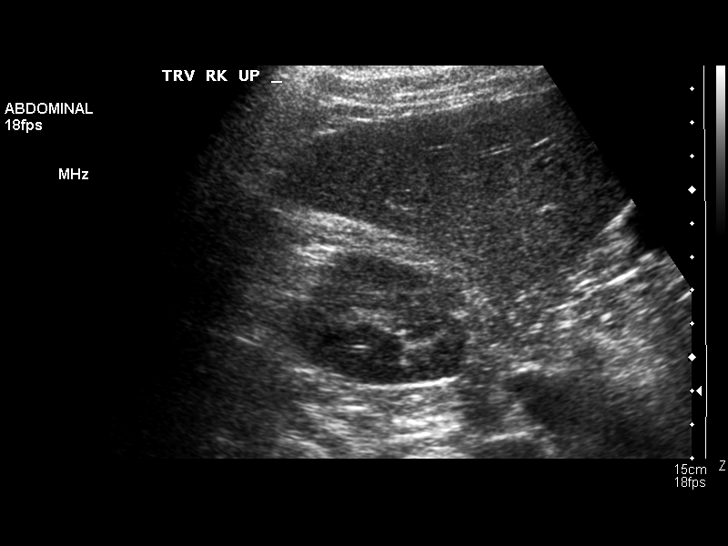
[im 41/97]
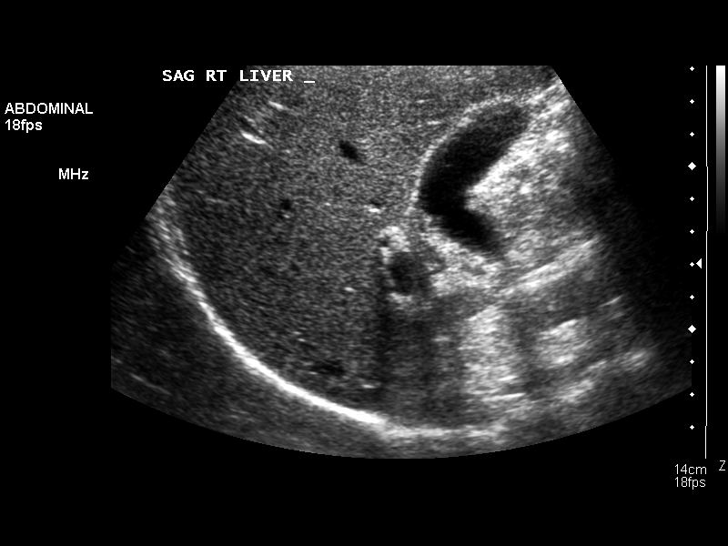
[im 49/97]
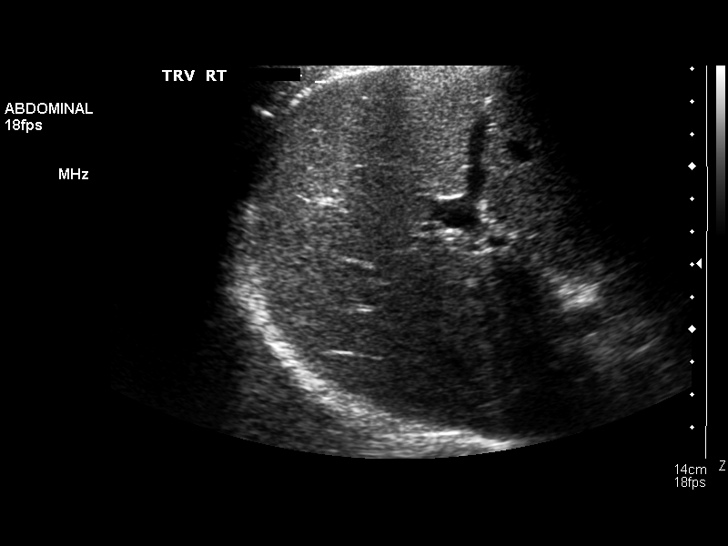
[im 57/97]
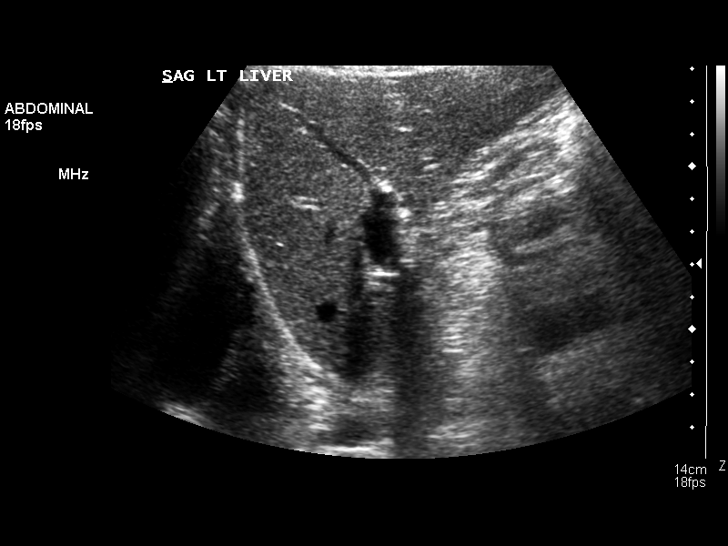
[im 65/97]
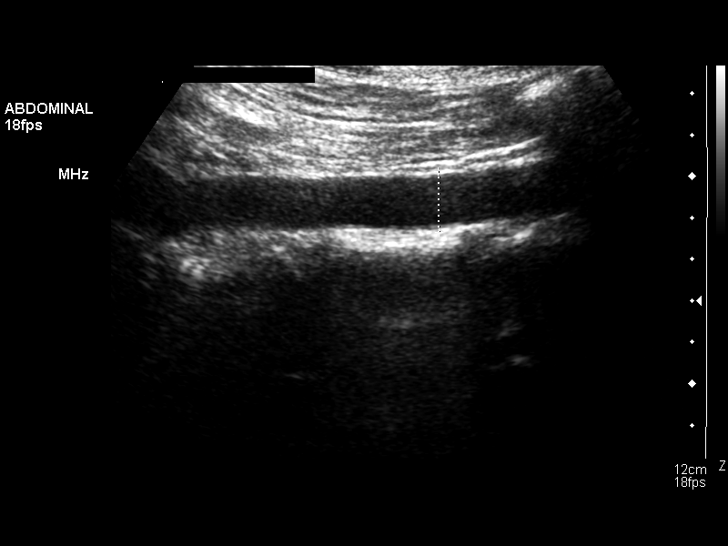
[im 73/97]
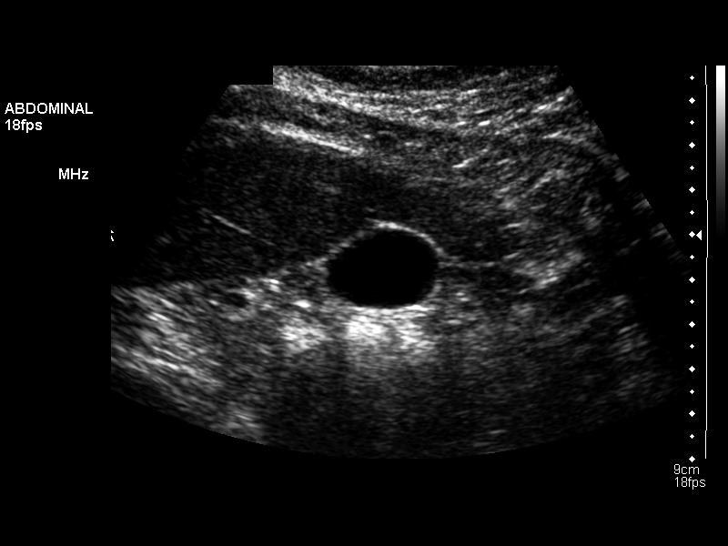
[im 81/97]
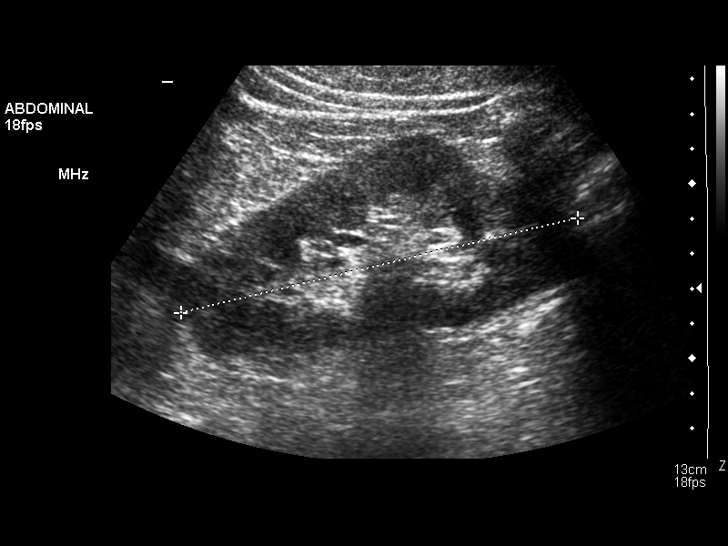
[im 89/97]
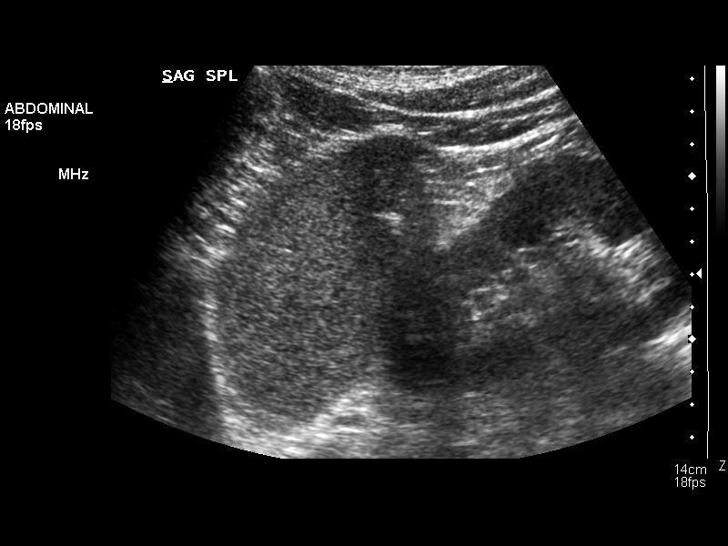
[im 97/97]
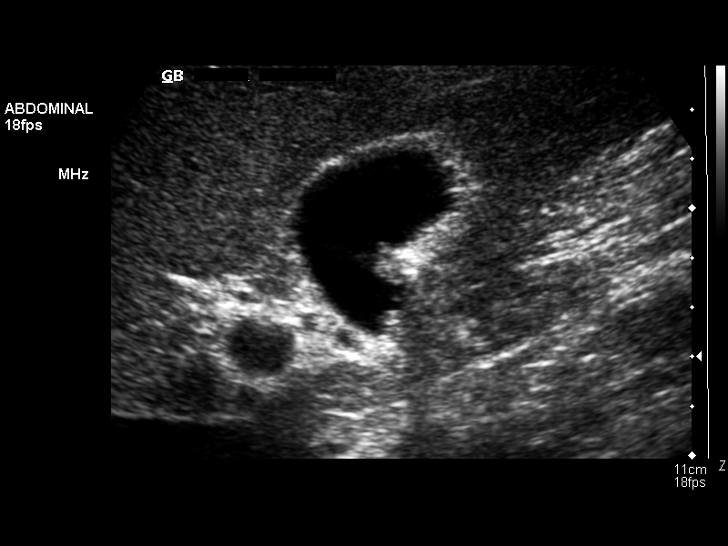

[13 of 25 positions shown; findings below may reference images not displayed]

FINDINGS: Gallbladder:  The gallbladder is visualized and there are several
nonshadowing non mobile polypoid lesions present most consistent
with polyps. The largest of these probable polyps measures 8 mm in
the neck of the gallbladder.  Small adherent gallstones are
difficult to exclude of no more than 3 mm in diameter.  No pain is
present over the gallbladder upon compression.

Common bile duct:  The common bile duct is normal measuring 3.5 mm
in diameter.

Liver:  The liver is slightly echogenic suggesting mild fatty
infiltration.  No focal abnormality is seen.

IVC:  Appears normal.

Pancreas:  The pancreas is largely obscured by bowel gas.

Spleen:  The spleen is normal measuring 4.4 mm sagittally.

Right Kidney:  No hydronephrosis is seen.  The right kidney
measures 12.2 cm sagittally.

Left Kidney:  No hydronephrosis.  The left kidney measures 11.7 cm.

Abdominal aorta:  The abdominal aorta is normal in caliber.
IMPRESSION: 1.  Several gallbladder polyps with the largest measuring 8 mm in
diameter.  No definite gallstones are seen and there is no pain
over the gallbladder upon compression.
2.  Mild fatty infiltration of the liver.
3.  The pancreas is obscured by bowel gas.

## 2011-08-15 ENCOUNTER — Other Ambulatory Visit (INDEPENDENT_AMBULATORY_CARE_PROVIDER_SITE_OTHER): Payer: BC Managed Care – PPO

## 2011-08-15 ENCOUNTER — Ambulatory Visit (INDEPENDENT_AMBULATORY_CARE_PROVIDER_SITE_OTHER): Payer: BC Managed Care – PPO | Admitting: Internal Medicine

## 2011-08-15 ENCOUNTER — Encounter: Payer: Self-pay | Admitting: Internal Medicine

## 2011-08-15 VITALS — BP 142/78 | HR 94 | Temp 97.8°F | Resp 16 | Wt 184.2 lb

## 2011-08-15 DIAGNOSIS — E785 Hyperlipidemia, unspecified: Secondary | ICD-10-CM

## 2011-08-15 DIAGNOSIS — I10 Essential (primary) hypertension: Secondary | ICD-10-CM

## 2011-08-15 DIAGNOSIS — R51 Headache: Secondary | ICD-10-CM

## 2011-08-15 DIAGNOSIS — R930 Abnormal findings on diagnostic imaging of skull and head, not elsewhere classified: Secondary | ICD-10-CM

## 2011-08-15 DIAGNOSIS — G43509 Persistent migraine aura without cerebral infarction, not intractable, without status migrainosus: Secondary | ICD-10-CM

## 2011-08-15 DIAGNOSIS — R519 Headache, unspecified: Secondary | ICD-10-CM | POA: Insufficient documentation

## 2011-08-15 LAB — URINALYSIS, ROUTINE W REFLEX MICROSCOPIC
Ketones, ur: NEGATIVE
Specific Gravity, Urine: 1.01 (ref 1.000–1.030)
Urine Glucose: NEGATIVE
Urobilinogen, UA: 0.2 (ref 0.0–1.0)
pH: 6.5 (ref 5.0–8.0)

## 2011-08-15 LAB — CBC WITH DIFFERENTIAL/PLATELET
Basophils Absolute: 0 10*3/uL (ref 0.0–0.1)
Eosinophils Absolute: 0.1 10*3/uL (ref 0.0–0.7)
HCT: 42.4 % (ref 39.0–52.0)
Hemoglobin: 14.7 g/dL (ref 13.0–17.0)
Lymphocytes Relative: 31.1 % (ref 12.0–46.0)
Lymphs Abs: 1.8 10*3/uL (ref 0.7–4.0)
MCHC: 34.7 g/dL (ref 30.0–36.0)
Neutro Abs: 3.4 10*3/uL (ref 1.4–7.7)
Platelets: 280 10*3/uL (ref 150.0–400.0)
RDW: 12.9 % (ref 11.5–14.6)

## 2011-08-15 LAB — COMPREHENSIVE METABOLIC PANEL
ALT: 43 U/L (ref 0–53)
AST: 29 U/L (ref 0–37)
Creatinine, Ser: 0.9 mg/dL (ref 0.4–1.5)
Total Bilirubin: 0.6 mg/dL (ref 0.3–1.2)

## 2011-08-15 LAB — SEDIMENTATION RATE: Sed Rate: 10 mm/hr (ref 0–22)

## 2011-08-15 MED ORDER — ELETRIPTAN HYDROBROMIDE 40 MG PO TABS: 40.0000 mg | ORAL_TABLET | ORAL | Status: DC | PRN

## 2011-08-15 NOTE — Assessment & Plan Note (Signed)
His BP is well controlled, I will check his lytes and renal function today 

## 2011-08-15 NOTE — Patient Instructions (Signed)

## 2011-08-15 NOTE — Assessment & Plan Note (Signed)
He has a new onset headache syndrome so I think he needs to have an MRI of the brain done to look for mass, tumor, aneurysm. Also, I will check some labs on him to look for vasculitis, anemia, abnormal lytes. Etc.

## 2011-08-15 NOTE — Assessment & Plan Note (Signed)
I gave him samples of relpax to try for the headache

## 2011-08-15 NOTE — Progress Notes (Signed)
Subjective:    Patient ID: Michael Shepard, male    DOB: [birth year], 55 y.o.   MRN: [id number]  Migraine  This is a new problem. Episode onset: 5 months ago. The problem occurs intermittently. The problem has been unchanged. The pain is located in the right unilateral and frontal region. The pain does not radiate. The pain quality is not similar to prior headaches. The quality of the pain is described as squeezing. The pain is at a severity of 3/10. The pain is mild. Associated symptoms include insomnia, phonophobia and photophobia. Pertinent negatives include no abdominal pain, abnormal behavior, anorexia, back pain, blurred vision, coughing, dizziness, drainage, ear pain, eye pain, eye redness, eye watering, facial sweating, fever, hearing loss, loss of balance, muscle aches, nausea, neck pain, numbness, scalp tenderness, seizures, sinus pressure, sore throat, swollen glands, tingling, tinnitus, visual change, vomiting, weakness or weight loss. The symptoms are aggravated by emotional stress. He has tried acetaminophen and NSAIDs for the symptoms. The treatment provided mild relief. His past medical history is significant for migraines in the family. There is no history of migraine headaches.      Review of Systems  Constitutional: Negative for fever, chills, weight loss, diaphoresis, activity change, appetite change, fatigue and unexpected weight change.  HENT: Negative for hearing loss, ear pain, sore throat, trouble swallowing, neck pain, voice change, sinus pressure and tinnitus.   Eyes: Positive for photophobia. Negative for blurred vision, pain, discharge, redness, itching and visual disturbance.  Respiratory: Negative for cough, chest tightness, shortness of breath, wheezing and stridor.   Cardiovascular: Negative for chest pain, palpitations and leg swelling.  Gastrointestinal: Negative for nausea, vomiting, abdominal pain, diarrhea, constipation, blood in stool and anorexia.    Genitourinary: Negative for dysuria, urgency, frequency, hematuria, flank pain, decreased urine volume, enuresis and difficulty urinating.  Musculoskeletal: Negative for myalgias, back pain, joint swelling, arthralgias and gait problem.  Skin: Negative for color change, pallor, rash and wound.  Neurological: Positive for light-headedness. Negative for dizziness, tingling, tremors, seizures, syncope, facial asymmetry, speech difficulty, weakness, numbness, headaches and loss of balance.  Hematological: Negative for adenopathy. Does not bruise/bleed easily.  Psychiatric/Behavioral: Negative for suicidal ideas, hallucinations, behavioral problems, confusion, sleep disturbance, self-injury, dysphoric mood, decreased concentration and agitation. The patient has insomnia. The patient is not nervous/anxious and is not hyperactive.        Objective:   Physical Exam  Constitutional: He is oriented to person, place, and time. He appears well-developed and well-nourished. No distress.  HENT:  Head: Normocephalic and atraumatic.  Mouth/Throat: No oropharyngeal exudate.  Eyes: Conjunctivae and EOM are normal. Pupils are equal, round, and reactive to light. Right eye exhibits no discharge. Left eye exhibits no discharge. No scleral icterus.  Neck: Normal range of motion. Neck supple. No JVD present. No tracheal deviation present. No thyromegaly present.  Cardiovascular: Normal rate, regular rhythm, normal heart sounds and intact distal pulses.  Exam reveals no gallop and no friction rub.   No murmur heard. Pulmonary/Chest: Effort normal and breath sounds normal. No stridor. No respiratory distress. He has no wheezes. He has no rales. He exhibits no tenderness.  Abdominal: Soft. Bowel sounds are normal. He exhibits no distension and no mass. There is no tenderness. There is no rebound and no guarding.  Musculoskeletal: Normal range of motion. He exhibits no edema and no tenderness.  Lymphadenopathy:    He  has no cervical adenopathy.  Neurological: He is alert and oriented to person, place, and time. He has  normal strength. He displays no atrophy, no tremor and normal reflexes. No cranial nerve deficit or sensory deficit. He exhibits normal muscle tone. He displays a negative Romberg sign. He displays no seizure activity. Coordination and gait normal. He displays no Babinski's sign on the right side. He displays no Babinski's sign on the left side.  Reflex Scores:      Tricep reflexes are 1+ on the right side and 1+ on the left side.      Bicep reflexes are 1+ on the right side.      Brachioradialis reflexes are 1+ on the right side and 1+ on the left side.      Patellar reflexes are 1+ on the right side and 1+ on the left side.      Achilles reflexes are 1+ on the right side and 1+ on the left side. Skin: Skin is warm and dry. No rash noted. He is not diaphoretic. No erythema. No pallor.  Psychiatric: He has a normal mood and affect. His behavior is normal. Judgment and thought content normal.          Assessment & Plan:

## 2011-08-17 ENCOUNTER — Ambulatory Visit (HOSPITAL_COMMUNITY): Payer: BC Managed Care – PPO

## 2011-08-21 ENCOUNTER — Ambulatory Visit (HOSPITAL_COMMUNITY)
Admission: RE | Admit: 2011-08-21 | Discharge: 2011-08-21 | Disposition: A | Discharge status: Home / Self Care | Payer: BC Managed Care – PPO | Source: Ambulatory Visit | Attending: Internal Medicine | Admitting: Internal Medicine

## 2011-08-21 DIAGNOSIS — I10 Essential (primary) hypertension: Secondary | ICD-10-CM | POA: Insufficient documentation

## 2011-08-21 DIAGNOSIS — G43509 Persistent migraine aura without cerebral infarction, not intractable, without status migrainosus: Secondary | ICD-10-CM | POA: Insufficient documentation

## 2011-08-21 DIAGNOSIS — R51 Headache: Secondary | ICD-10-CM

## 2011-08-21 DIAGNOSIS — E785 Hyperlipidemia, unspecified: Secondary | ICD-10-CM

## 2011-08-21 DIAGNOSIS — R930 Abnormal findings on diagnostic imaging of skull and head, not elsewhere classified: Secondary | ICD-10-CM | POA: Insufficient documentation

## 2011-08-21 NOTE — Progress Notes (Signed)
Addended by: Etta Grandchild on: 08/21/2011 06:00 PM   Modules accepted: Orders

## 2011-10-04 ENCOUNTER — Ambulatory Visit (INDEPENDENT_AMBULATORY_CARE_PROVIDER_SITE_OTHER): Payer: BC Managed Care – PPO | Admitting: Neurology

## 2011-10-04 ENCOUNTER — Encounter: Payer: Self-pay | Admitting: Neurology

## 2011-10-04 VITALS — BP 140/78 | HR 84 | Ht 70.5 in | Wt 184.0 lb

## 2011-10-04 DIAGNOSIS — Q283 Other malformations of cerebral vessels: Secondary | ICD-10-CM

## 2011-10-04 NOTE — Progress Notes (Signed)
Dear Dr. Debby Bud,  Thank you for having me see Michael Shepard in consultation today at [facility] for his problem with abnormal MRI and migraine headaches.  As you may recall, he is a 56 y.o. year old male with a history of hypertension who presents with a series of severe headaches starting in December of this year. He's had approximately 5 in total. Each of these started with "lightening bolts" across his vision. He then lost vision in his right lower quadrant. After about 15 minutes these were followed by bifrontal headaches.  He has a history of severe headaches as a child. His mother also has severe headaches. Dr. 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 Cavernous malformation as well as a developmental venous anomaly or in the diagnostic differential as well.  Interestingly he states that event in 2009 when he heard a "pop" in his head. This is accompanied by headache but remitted quickly after minutes.  He's been using Relpax as an abortive. This has been successful. Ibuprofen also has a degree of effectiveness.  Past Medical History  Diagnosis Date  . Generalized anxiety disorder   . Other and unspecified hyperlipidemia   . Unspecified essential hypertension   . Esophageal reflux     Past Surgical History  Procedure Date  . Hemorrhoid surgery 1998  . Wisdom tooth extraction     History   Social History  . Marital Status: Legally Separated    Spouse Name: N/A    Number of Children: N/A  . Years of Education: N/A   Social History Main Topics  . Smoking status: Former Smoker    Quit date: 10/03/2005  . Smokeless tobacco: Never Used  . Alcohol Use: No  . Drug Use: No  . Sexually Active: Yes   Other Topics Concern  . None   Social History Narrative  . None    Family History  Problem Relation Age of Onset  . Ovarian cancer Mother     . Migraines Mother   . Diabetes Father   . Coronary artery disease Maternal Grandfather   . Prostate cancer Neg Hx   . Colon cancer Neg Hx   . Diabetes Other     Current Outpatient Prescriptions on File Prior to Visit  Medication Sig Dispense Refill  . eletriptan (RELPAX) 40 MG tablet One tablet by mouth as needed for migraine headache.  If the headache improves and then returns, dose may be repeated after 2 hours have elapsed since first dose (do not exceed 80 mg per day). may repeat in 2 hours if necessary  6 tablet  0  . omeprazole (PRILOSEC) 20 MG capsule Take 2 capsules (40 mg total) by mouth daily.  180 capsule  3  . simvastatin (ZOCOR) 40 MG tablet Take 1 tablet (40 mg total) by mouth at bedtime.  90 tablet  3  . valsartan-hydrochlorothiazide (DIOVAN-HCT) 80-12.5 MG per tablet Take 1 tablet by mouth daily.  90 tablet  3    Allergies  Allergen Reactions  . Erythromycin   . Penicillins     REACTION: Swelling Hives      ROS:  13 systems were reviewed and are notable for high blood pressure.  All other review of systems are unremarkable.   Examination:  Filed Vitals:   10/04/11 1526  BP: 140/78  Pulse: 84  Height: 5' 10.5" (1.791 m)  Weight: 184  lb (83.462 kg)     In general, well appearing man.  Cardiovascular: The patient has a regular rate and rhythm and no carotid bruits.  Fundoscopy:  Disks are flat. Vessel caliber within normal limits.  Mental status:   The patient is oriented to person, place and time. Recent and remote memory are intact. Attention span and concentration are normal. Language including repetition, naming, following commands are intact. Fund of knowledge of current and historical events, as well as vocabulary are normal.  Cranial Nerves: Pupils are equally round and reactive to light. Visual fields full to confrontation. Extraocular movements are intact without nystagmus. Facial sensation and muscles of mastication are intact. Muscles of  facial expression are symmetric. Hearing intact to bilateral finger rub. Tongue protrusion, uvula, palate midline.  Shoulder shrug intact  Motor:  The patient has normal bulk and tone, no pronator drift.  There are no adventitious movements.  5/5 bilaterally.  Reflexes:   Biceps  Triceps Brachioradialis Knee Ankle  Right 2+  2+  2+   2+ 2+  Left  2+  2+  2+   2+ 2+  Toes down  Coordination:  Normal finger to nose.  No dysdiadokinesia.  Sensation is intact to temperature and vibration.  Gait and Station are normal.  Tandem gait is intact.  Romberg is negative  MRI brain was reviewed and revealed right frontal punctate hemorrhage on GRE, and multiple flow voids with gliosis in the same area on flare.  Impression/Recs: 1.  Migraine headaches - I think these are unrelated to his probable AVM.  I have asked him to use ibuprofen or naproxen for the headaches as Relpax is a vasoconstrictor and could precipitate a bleed.  If his migraines get more frequent we will consider a preventative. 2.  Possible AVM - I am going to send the patient to Ardyth Man or Hadassah Pais at [facility] for their opinion about the natural history and proper management of the lesion.  I think he will likely need an arteriogram.  We will see the patient back in 3 months.  Thank you for having [place] see Michael Shepard in consultation.  Feel free to contact me with any questions.  Lupita Raider Modesto Charon, MD [facility], [place] [address] [place], [place] [address] Phone: [phone] Fax: [phone].

## 2011-10-07 DIAGNOSIS — Q283 Other malformations of cerebral vessels: Secondary | ICD-10-CM | POA: Insufficient documentation

## 2011-10-08 ENCOUNTER — Telehealth: Payer: Self-pay | Admitting: Neurology

## 2011-10-08 NOTE — Telephone Encounter (Signed)
Called and spoke with the patient. Information given as directed by Dr. Modesto Charon re: referral to [facility] for AVM management. The patient has an appointment with Dr. Lanna Poche (first available) on 10/22/11 at 3 PM. He understands that he must take the MRI CD with him to the appointment. He will call [facility] to obtain a copy. Phone number given. All requested information faxed to [facility] at [phone] Attn: Crystal. No other concerns voiced by the patient at this time. **Dr. Modesto Charon....FYI.

## 2011-10-08 NOTE — Telephone Encounter (Signed)
Message copied by Benay Spice on Mon Oct 08, 2011  8:37 AM ------      Message from: Milas Gain      Created: Fri Oct 05, 2011  5:43 PM       Hi Jan.  Just saw this guy on Thursday.  We need to consult Hadassah Pais or Ardyth Man at [place], Interventional Neuroradiology. re arteriogram/AVM management.  Whoever is available first.            If you can get the patient to get the disks of his MRI images and bring them with him that is important.            Matt

## 2011-10-23 ENCOUNTER — Other Ambulatory Visit (HOSPITAL_COMMUNITY): Payer: Self-pay | Admitting: Neuroradiology

## 2011-10-23 DIAGNOSIS — D1800 Hemangioma unspecified site: Secondary | ICD-10-CM

## 2011-10-25 ENCOUNTER — Ambulatory Visit (HOSPITAL_COMMUNITY)
Admission: RE | Admit: 2011-10-25 | Discharge: 2011-10-25 | Disposition: A | Discharge status: Home / Self Care | Payer: BC Managed Care – PPO | Source: Ambulatory Visit | Attending: Neuroradiology | Admitting: Neuroradiology

## 2011-10-25 DIAGNOSIS — D1802 Hemangioma of intracranial structures: Secondary | ICD-10-CM | POA: Insufficient documentation

## 2011-10-25 DIAGNOSIS — D1800 Hemangioma unspecified site: Secondary | ICD-10-CM

## 2011-10-25 MED ORDER — GADOBENATE DIMEGLUMINE 529 MG/ML IV SOLN
17.0000 mL | Freq: Once | INTRAVENOUS | Status: AC | PRN
  Administered 2011-10-25: 17 mL via INTRAVENOUS

## 2011-10-26 LAB — POCT I-STAT, CHEM 8
BUN: 12 mg/dL (ref 6–23)
Calcium, Ion: 1.15 mmol/L (ref 1.12–1.32)
Chloride: 101 meq/L (ref 96–112)
Creatinine, Ser: 0.8 mg/dL (ref 0.50–1.35)
Glucose, Bld: 149 mg/dL — ABNORMAL HIGH (ref 70–99)
HCT: 41 % (ref 39.0–52.0)
Hemoglobin: 13.9 g/dL (ref 13.0–17.0)
Potassium: 3.5 meq/L (ref 3.5–5.1)
Sodium: 139 mEq/L (ref 135–145)
TCO2: 27 mmol/L (ref 0–100)

## 2011-10-30 NOTE — Progress Notes (Signed)
Patient saw Michael Shepard.  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

## 2011-12-24 ENCOUNTER — Other Ambulatory Visit: Payer: Self-pay

## 2011-12-24 MED ORDER — VALSARTAN-HYDROCHLOROTHIAZIDE 80-12.5 MG PO TABS: 1.0000 | ORAL_TABLET | Freq: Every day | ORAL | Status: DC

## 2012-01-01 ENCOUNTER — Ambulatory Visit: Payer: BC Managed Care – PPO | Admitting: Neurology

## 2012-01-08 ENCOUNTER — Other Ambulatory Visit: Payer: Self-pay | Admitting: *Deleted

## 2012-01-08 MED ORDER — OMEPRAZOLE 20 MG PO CPDR: 40.0000 mg | DELAYED_RELEASE_CAPSULE | Freq: Every day | ORAL | Status: DC

## 2012-01-16 ENCOUNTER — Encounter: Payer: Self-pay | Admitting: Gastroenterology

## 2012-02-08 ENCOUNTER — Other Ambulatory Visit: Payer: Self-pay | Admitting: *Deleted

## 2012-02-08 MED ORDER — VALSARTAN-HYDROCHLOROTHIAZIDE 80-12.5 MG PO TABS: 1.0000 | ORAL_TABLET | Freq: Every day | ORAL | Status: DC

## 2012-02-08 MED ORDER — OMEPRAZOLE 20 MG PO CPDR: 40.0000 mg | DELAYED_RELEASE_CAPSULE | Freq: Every day | ORAL | Status: DC

## 2012-02-08 MED ORDER — SIMVASTATIN 40 MG PO TABS: 40.0000 mg | ORAL_TABLET | Freq: Every day | ORAL | Status: DC

## 2012-02-08 NOTE — Telephone Encounter (Signed)
NEW INSURANCE CARD FOR COVENTRY HEALTH CARD OF [place] SCANNED INTO SYSTEM. REFILL ON Rx SENT TO [facility] BY MAIL ORDER. WITH NOTE FOR PATIENT TO MAKE APPT. WITH DR. Debby Bud FOR FURTHER REFILLS

## 2012-02-11 ENCOUNTER — Ambulatory Visit (AMBULATORY_SURGERY_CENTER): Payer: PRIVATE HEALTH INSURANCE | Admitting: *Deleted

## 2012-02-11 ENCOUNTER — Telehealth: Payer: Self-pay | Admitting: *Deleted

## 2012-02-11 VITALS — Ht 70.0 in | Wt 180.0 lb

## 2012-02-11 DIAGNOSIS — Z1211 Encounter for screening for malignant neoplasm of colon: Secondary | ICD-10-CM

## 2012-02-11 NOTE — Telephone Encounter (Signed)
PATIENT'S LAST COLONOSCOPY WAS MARCH 22,2006,DIVERTICULOSIS ONLY. PATIENT DENIES ANY GI PROBLEMS AT THIS TIME AND DENIES ANY FAMILY HX OF COLON CANCER. PATIENT STATES HIS INSURANCE COMPANY SENT HIM LETTER TELLING HIM HE WAS PASSED DUE FOR COLONOSCOPY AND HE SHOULD HAVE IT DONE EVERY 5 YEARS. NO RECALL LISTED ON COLON REPORT. CHART ON DESK, PLEASE REVIEW.  IS PATIENT DUE FOR COLONOSCOPY? PLEASE ADVISE,THANKS PREVISIT.

## 2012-02-11 NOTE — Progress Notes (Signed)
PATIENT'S LAST COLONOSCOPY WAS 2006, PATIENT DENIES ANY FAMILY HX COLON CA AND DENIES ANY GI PROBLEMS AT THIS TIME. CHART PLACED ON DR.PATTERSON'S DESK FOR REVIEW. ? IS COLONOSCOPY DUE AT THIS TIME.

## 2012-02-12 ENCOUNTER — Other Ambulatory Visit: Payer: Self-pay

## 2012-02-12 MED ORDER — OMEPRAZOLE 20 MG PO CPDR: 40.0000 mg | DELAYED_RELEASE_CAPSULE | Freq: Every day | ORAL | Status: DC

## 2012-02-15 NOTE — Telephone Encounter (Signed)
No response from Dr Jarold Motto as of 02/15/12 0755. Michael Shepard

## 2012-02-17 NOTE — Telephone Encounter (Signed)
Would go ahead,almost 10y

## 2012-02-18 MED ORDER — MOVIPREP 100 G PO SOLR: ORAL | Status: DC

## 2012-02-18 NOTE — Telephone Encounter (Signed)
Noted. Sent moviprep to patient's pharmacy and called pt. No answer, Left message on home #. [employer]

## 2012-02-25 ENCOUNTER — Ambulatory Visit (AMBULATORY_SURGERY_CENTER): Payer: PRIVATE HEALTH INSURANCE | Admitting: Gastroenterology

## 2012-02-25 ENCOUNTER — Encounter: Payer: Self-pay | Admitting: Gastroenterology

## 2012-02-25 VITALS — BP 136/84 | HR 97 | Temp 98.5°F | Resp 10 | Ht 70.0 in | Wt 180.0 lb

## 2012-02-25 DIAGNOSIS — K5730 Diverticulosis of large intestine without perforation or abscess without bleeding: Secondary | ICD-10-CM

## 2012-02-25 DIAGNOSIS — Z1211 Encounter for screening for malignant neoplasm of colon: Secondary | ICD-10-CM

## 2012-02-25 MED ORDER — SODIUM CHLORIDE 0.9 % IV SOLN: 500.0000 mL | INTRAVENOUS | Status: DC

## 2012-02-25 NOTE — Patient Instructions (Signed)
YOU HAD AN ENDOSCOPIC PROCEDURE TODAY AT THE [facility]: Refer to the procedure report that was given to you for any specific questions about what was found during the examination.  If the procedure report does not answer your questions, please call your gastroenterologist to clarify.  If you requested that your care partner not be given the details of your procedure findings, then the procedure report has been included in a sealed envelope for you to review at your convenience later.  YOU SHOULD EXPECT: Some feelings of bloating in the abdomen. Passage of more gas than usual.  Walking can help get rid of the air that was put into your GI tract during the procedure and reduce the bloating. If you had a lower endoscopy (such as a colonoscopy or flexible sigmoidoscopy) you may notice spotting of blood in your stool or on the toilet paper. If you underwent a bowel prep for your procedure, then you may not have a normal bowel movement for a few days.  DIET: Your first meal following the procedure should be a light meal and then it is ok to progress to your normal diet.  A half-sandwich or bowl of soup is an example of a good first meal.  Heavy or fried foods are harder to digest and may make you feel nauseous or bloated.  Likewise meals heavy in dairy and vegetables can cause extra gas to form and this can also increase the bloating.  Drink plenty of fluids but you should avoid alcoholic beverages for 24 hours.  ACTIVITY: Your care partner should take you home directly after the procedure.  You should plan to take it easy, moving slowly for the rest of the day.  You can resume normal activity the day after the procedure however you should NOT DRIVE or use heavy machinery for 24 hours (because of the sedation medicines used during the test).    SYMPTOMS TO REPORT IMMEDIATELY: A gastroenterologist can be reached at any hour.  During normal business hours, 8:30 AM to 5:00 PM Monday through Friday,  call [phone].  After hours and on weekends, please call the GI answering service at [phone] who will take a message and have the physician on call contact you.   Following lower endoscopy (colonoscopy or flexible sigmoidoscopy):  Excessive amounts of blood in the stool  Significant tenderness or worsening of abdominal pains  Swelling of the abdomen that is new, acute  Fever of 100F or higher    FOLLOW UP: If any biopsies were taken you will be contacted by phone or by letter within the next 1-3 weeks.  Call your gastroenterologist if you have not heard about the biopsies in 3 weeks.  Our staff will call the home number listed on your records the next business day following your procedure to check on you and address any questions or concerns that you may have at that time regarding the information given to you following your procedure. This is a courtesy call and so if there is no answer at the home number and we have not heard from you through the emergency physician on call, we will assume that you have returned to your regular daily activities without incident.  SIGNATURES/CONFIDENTIALITY: You and/or your care partner have signed paperwork which will be entered into your electronic medical record.  These signatures attest to the fact that that the information above on your After Visit Summary has been reviewed and is understood.  Full responsibility of the confidentiality   of this discharge information lies with you and/or your care-partner.    Information on diverticulosis & high fiber diet given to you today  Repeat colonoscopy in 5 years with extra prep

## 2012-02-25 NOTE — Op Note (Signed)
[facility] [address] [place], [place]  [address]  COLONOSCOPY PROCEDURE REPORT  PATIENT:  Michael Shepard, Michael Shepard  MR#:  [id number] BIRTHDATE:  [birth year], 56 yrs. old  GENDER:  male ENDOSCOPIST:  Vania Rea. Jarold Motto, MD, [facility] REF. BY:  Rosalyn Gess. Norins, M.D. PROCEDURE DATE:  02/25/2012 PROCEDURE:  Average-risk screening colonoscopy G0121 ASA CLASS:  Class II INDICATIONS:  Routine Risk Screening MEDICATIONS:   propofol (Diprivan) 350 mg IV  DESCRIPTION OF PROCEDURE:   After the risks and benefits and of the procedure were explained, informed consent was obtained. Digital rectal exam was performed and revealed no abnormalities. The LB [id number]-[id number] endoscope was introduced through the anus and advanced to the cecum, which was identified by both the appendix and ileocecal valve.  The quality of the prep was poor, using MoviPrep.  The instrument was then slowly withdrawn as the colon was fully examined. <<PROCEDUREIMAGES>>  FINDINGS:  There were mild diverticular changes in left colon. diverticulosis was found.  No polyps or cancers were seen. cannot exclude small polyps per poor prep!!!  This was otherwise a normal examination of the colon.   Retroflexed views in the rectum revealed no abnormalities.    The scope was then withdrawn from the patient and the procedure completed.  COMPLICATIONS:  None ENDOSCOPIC IMPRESSION: 1) Diverticulosis,mild,left sided diverticulosis 2) No polyps or cancers 3) Otherwise normal examination RECOMMENDATIONS: 1) High fiber diet. f/u 5 y with "double prep".  REPEAT EXAM:  No  ______________________________ Vania Rea. Jarold Motto, MD, [facility]  CC:  n. eSIGNED:   Vania Rea. Helton Oleson at 02/25/2012 10:44 AM  Sheran Luz, [phone]

## 2012-02-25 NOTE — Progress Notes (Signed)
Patient did not experience any of the following events: a burn prior to discharge; a fall within the facility; wrong site/side/patient/procedure/implant event; or a hospital transfer or hospital admission upon discharge from the facility. (G8907) Patient did not have preoperative order for IV antibiotic SSI prophylaxis. (G8918)  

## 2012-02-25 NOTE — Progress Notes (Signed)
The pt tolerated the colonoscopy very well. Maw   

## 2012-02-26 ENCOUNTER — Encounter: Payer: Self-pay | Admitting: Internal Medicine

## 2012-02-26 ENCOUNTER — Telehealth: Payer: Self-pay

## 2012-02-26 ENCOUNTER — Ambulatory Visit (INDEPENDENT_AMBULATORY_CARE_PROVIDER_SITE_OTHER): Payer: PRIVATE HEALTH INSURANCE | Admitting: Internal Medicine

## 2012-02-26 ENCOUNTER — Other Ambulatory Visit (INDEPENDENT_AMBULATORY_CARE_PROVIDER_SITE_OTHER): Payer: PRIVATE HEALTH INSURANCE

## 2012-02-26 VITALS — BP 120/80 | HR 80 | Temp 97.1°F | Resp 16 | Ht 70.0 in | Wt 181.0 lb

## 2012-02-26 DIAGNOSIS — K219 Gastro-esophageal reflux disease without esophagitis: Secondary | ICD-10-CM

## 2012-02-26 DIAGNOSIS — Z0000 Encounter for general adult medical examination without abnormal findings: Secondary | ICD-10-CM

## 2012-02-26 DIAGNOSIS — I10 Essential (primary) hypertension: Secondary | ICD-10-CM

## 2012-02-26 DIAGNOSIS — F411 Generalized anxiety disorder: Secondary | ICD-10-CM

## 2012-02-26 DIAGNOSIS — E785 Hyperlipidemia, unspecified: Secondary | ICD-10-CM

## 2012-02-26 LAB — HEPATIC FUNCTION PANEL
Albumin: 4.1 g/dL (ref 3.5–5.2)
Alkaline Phosphatase: 54 U/L (ref 39–117)

## 2012-02-26 LAB — LIPID PANEL
HDL: 64.9 mg/dL (ref >39.00)
LDL Cholesterol: 96 mg/dL (ref 0–99)
Total CHOL/HDL Ratio: 3
Triglycerides: 72 mg/dL (ref 0.0–149.0)

## 2012-02-26 MED ORDER — LOSARTAN POTASSIUM-HCTZ 50-12.5 MG PO TABS: 1.0000 | ORAL_TABLET | Freq: Every day | ORAL | Status: DC

## 2012-02-26 NOTE — Assessment & Plan Note (Signed)
Stable and doing well. 

## 2012-02-26 NOTE — Telephone Encounter (Signed)
  Follow up Call-  Call back number 02/25/2012  Post procedure Call Back phone  # [phone]  Permission to leave phone message Yes     Patient questions:  Do you have a fever, pain , or abdominal swelling? no Pain Score  0 *  Have you tolerated food without any problems? yes  Have you been able to return to your normal activities? yes  Do you have any questions about your discharge instructions: Diet   no Medications  no Follow up visit  no  Do you have questions or concerns about your Care? no  Actions: * If pain score is 4 or above: No action needed, pain <4.  Per the pt, "No, I did not have a problem". Maw

## 2012-02-26 NOTE — Assessment & Plan Note (Signed)
LDL better than goal of 130 or less.  Plan Continue healthy lifestyle

## 2012-02-26 NOTE — Progress Notes (Signed)
Subjective:    Patient ID: Michael Shepard, male    DOB: 01/30/1956, 56 y.o.   MRN: [id number]  HPI Michael Shepard presents for routine medical follow up. In the interval since his last visit he has been evaluated by Dr. 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 He has otherwise been doing well. He is safe, no injuries or other medical events.  Past Medical History  Diagnosis Date  . Generalized anxiety disorder   . Other and unspecified hyperlipidemia   . Unspecified essential hypertension   . Esophageal reflux    Past Surgical History  Procedure Date  . Hemorrhoid surgery 1998  . Wisdom tooth extraction   . Colonoscopy 11-22-04   Family History  Problem Relation Age of Onset  . Ovarian cancer Mother   . Migraines Mother   . Diabetes Father   . Coronary artery disease Maternal Grandfather   . Prostate cancer Neg Hx   . Colon cancer Neg Hx   . Diabetes Other    History   Social History  . Marital Status: Divorced    Spouse Name: N/A    Number of Children: 0  . Years of Education: 16   Occupational History  . pulishing    Social History Main Topics  . Smoking status: Former Smoker    Quit date: 10/03/2005  . Smokeless tobacco: Never Used  . Alcohol Use: Yes     DRINKS WINE OR MIXED DRINK 3-4 TIMES PER WEEK PER PT.  . Drug Use: No  . Sexually Active: Yes -- Male partner(s)   Other Topics Concern  . Not on file   Social History Narrative   HSG, [employer] - BA. Married '86 - '11/divorced. He is in a monogamous relationship (June '13). 1 daughter - stepchild '80. 2 grandchildren but given up for adoption.  work: Systems developer for [employer]. Hobbies: hiking and outdoor activity. Reviewed: ACP - raised the issue.    Current Outpatient Prescriptions on File Prior to Visit  Medication Sig Dispense Refill  . omeprazole (PRILOSEC) 20 MG capsule Take 2 capsules (40 mg total) by mouth daily. MUST  MAKE APPT. WITH DR. Debby Bud FOR FURTHER REFILLS  180 capsule  0  . simvastatin (ZOCOR) 40 MG tablet Take 1 tablet (40 mg total) by mouth at bedtime.  90 tablet  0  . losartan-hydrochlorothiazide (HYZAAR) 50-12.5 MG per tablet Take 1 tablet by mouth daily.  90 tablet  3   Current Facility-Administered Medications on File Prior to Visit  Medication Dose Route Frequency Provider Last Rate Last Dose  . DISCONTD: 0.9 %  sodium chloride infusion  500 mL Intravenous Continuous Mardella Layman, MD           Review of Systems Constitutional:  Negative for fever, chills, activity change and unexpected weight change.  HEENT:  Negative for hearing loss, ear pain, congestion, neck stiffness and postnasal drip. Negative for sore throat or swallowing problems. Negative for dental complaints.   Eyes: Negative for vision loss or change in visual acuity.  Respiratory: Negative for chest tightness and wheezing. Negative for DOE.   Cardiovascular: Negative for chest pain or palpitations. No decreased exercise tolerance Gastrointestinal: No change in bowel habit. No bloating or gas. No reflux or indigestion Genitourinary: Negative for urgency, frequency, flank pain and difficulty urinating.  Musculoskeletal: Negative for myalgias, back pain, arthralgias and gait problem.  Neurological: Negative for dizziness, tremors, weakness  and headaches.  Hematological: Negative for adenopathy.  Psychiatric/Behavioral: Negative for behavioral problems and dysphoric mood.       Objective:   Physical Exam Filed Vitals:   02/26/12 0938  BP: 120/80  Pulse: 80  Temp: 97.1 F (36.2 C)  Resp: 16   Wt Readings from Last 3 Encounters:  02/26/12 181 lb (82.101 kg)  02/25/12 180 lb (81.647 kg)  02/11/12 180 lb (81.647 kg)    Gen'l: Well nourished well developed white male in no acute distress  HEENT: Head: Normocephalic and atraumatic. Right Ear: External ear normal. EAC/TM nl. Left Ear: External ear normal.  EAC/TM  nl. Nose: Nose normal. Mouth/Throat: Oropharynx is clear and moist. Dentition - native, in good repair. No buccal or palatal lesions. Posterior pharynx clear. Eyes: Conjunctivae and sclera clear. EOM intact. Pupils are equal, round, and reactive to light. Right eye exhibits no discharge. Left eye exhibits no discharge. Neck: Normal range of motion. Neck supple. No JVD present. No tracheal deviation present. No thyromegaly present.  Cardiovascular: Normal rate, regular rhythm, no gallop, no friction rub, no murmur heard.      Quiet precordium. 2+ radial and DP pulses . No carotid bruits Pulmonary/Chest: Effort normal. No respiratory distress or increased WOB, no wheezes, no rales. No chest wall deformity or CVAT. Abdominal: Soft. Bowel sounds are normal in all quadrants. He exhibits no distension, no tenderness, no rebound or guarding, No heptosplenomegaly  Genitourinary:   Musculoskeletal: Normal range of motion. He exhibits no edema and no tenderness.       Small and large joints without redness, synovial thickening or deformity. Full range of motion preserved about all small, median and large joints.  Lymphadenopathy:    He has no cervical or supraclavicular adenopathy.  Neurological: He is alert and oriented to person, place, and time. CN II-XII intact. DTRs 2+ and symmetrical biceps, radial and patellar tendons. Cerebellar function normal with no tremor, rigidity, normal gait and station.  Skin: Skin is warm and dry. No rash noted. No erythema.  Psychiatric: He has a normal mood and affect. His behavior is normal. Thought content normal.   Lab Results  Component Value Date   WBC 5.7 08/15/2011   HGB 13.9 10/25/2011   HCT 41.0 10/25/2011   PLT 280.0 08/15/2011   GLUCOSE 149* 10/25/2011   CHOL 175 02/26/2012   TRIG 72.0 02/26/2012   HDL 64.90 02/26/2012   LDLCALC 96 02/26/2012   ALT 92* 02/26/2012   AST 63* 02/26/2012   NA 139 10/25/2011   K 3.5 10/25/2011   CL 101 10/25/2011   CREATININE 0.80  10/25/2011   BUN 12 10/25/2011   CO2 31 08/15/2011   TSH 1.90 08/15/2011          Assessment & Plan:

## 2012-02-26 NOTE — Assessment & Plan Note (Signed)
BP Readings from Last 3 Encounters:  02/26/12 120/80  02/25/12 136/84  10/04/11 140/78   Good control on present medication. Kidney function and electrolytes are normal

## 2012-02-26 NOTE — Assessment & Plan Note (Signed)
Interval history is benign. PHysical exam is normal. Labs are normal. He is current with colorectal cancer screening. Discussed pros and cons of prostate cancer screening (USPHCTF recommendations reviewed and ACU April '13 recommendations) and he defers evaluation at this time. Immunizations are current.  In summary- a very nice man who is medically stable and doing well. He is asked to return in 1 year for routine medical follow-up, sooner as needed.

## 2012-02-26 NOTE — Assessment & Plan Note (Signed)
Doing very well - off medication. Attributes this to new partner, absence of old partner

## 2012-03-02 ENCOUNTER — Encounter: Payer: Self-pay | Admitting: Internal Medicine

## 2012-05-07 ENCOUNTER — Other Ambulatory Visit: Payer: Self-pay | Admitting: Internal Medicine

## 2012-05-29 ENCOUNTER — Other Ambulatory Visit: Payer: Self-pay | Admitting: *Deleted

## 2012-05-29 DIAGNOSIS — E785 Hyperlipidemia, unspecified: Secondary | ICD-10-CM

## 2012-05-29 MED ORDER — LOSARTAN POTASSIUM-HCTZ 50-12.5 MG PO TABS: 1.0000 | ORAL_TABLET | Freq: Every day | ORAL | Status: DC

## 2012-05-29 NOTE — Telephone Encounter (Signed)
medication refill to [facility] for losartan hctz 50/12.5mg 

## 2012-06-04 ENCOUNTER — Telehealth: Payer: Self-pay | Admitting: Internal Medicine

## 2012-06-04 MED ORDER — OMEPRAZOLE 20 MG PO CPDR: 40.0000 mg | DELAYED_RELEASE_CAPSULE | Freq: Every day | ORAL | Status: DC

## 2012-06-04 MED ORDER — SIMVASTATIN 40 MG PO TABS: 40.0000 mg | ORAL_TABLET | Freq: Every day | ORAL | Status: DC

## 2012-06-04 NOTE — Telephone Encounter (Signed)
Caller: [name]/Patient; Patient Name: Michael Shepard; PCP: Illene Regulus (Adults only); Best Callback Phone Number: [phone].  Patient calling about losartan and omeprazole.  States he called [facility] and they have not received his losartan Rx, which was transmitted 05/29/12 per epic.  States he called [facility] in the day 06/04/12 and was told to check with them.  Wants new Rx sent to [facility].  Also needs omeprazole renewed for a full year; last well visit 02/26/12.  Info to office for staff/provider review/Rx/callback.  Uses [facility] phone [phone] option 2, or fax [phone]. MAY REACH PATIENT AT [phone].

## 2012-06-04 NOTE — Telephone Encounter (Signed)
Caller: [name]/Patient; Patient Name: Michael Shepard; PCP: Illene Regulus (Adults only); Best Callback Phone Number: [phone]  06-04-12 he said he was on his [facility] website and didn't see confirmation of his Losartin being filled  I checked in Epic and told him it was sent to [facility] on 05-29-12, he is going to call [facility] and verfiy they got Rx if not he will call back

## 2012-06-06 ENCOUNTER — Telehealth: Payer: Self-pay | Admitting: Internal Medicine

## 2012-06-06 DIAGNOSIS — E785 Hyperlipidemia, unspecified: Secondary | ICD-10-CM

## 2012-06-06 MED ORDER — LOSARTAN POTASSIUM-HCTZ 50-12.5 MG PO TABS: 1.0000 | ORAL_TABLET | Freq: Every day | ORAL | Status: DC

## 2012-06-06 NOTE — Telephone Encounter (Signed)
Pt advised, rx resent

## 2012-06-06 NOTE — Telephone Encounter (Signed)
Caller: Michael Shepard/Patient; PCP: Illene Regulus (Adults only); [facility] Callback Phone Number: [phone]. Second call for this concern. Patient calling regarding Losartin RX, states [facility] did not receive order from 05/29/12, states has received Prilosec and Zocor, not the generic he uses: Simvistatin and Ompeprazole. HE needs one year of Losartin and Generic Omeprazole to [facility] please.

## 2012-07-18 ENCOUNTER — Other Ambulatory Visit: Payer: Self-pay | Admitting: Internal Medicine

## 2013-01-08 ENCOUNTER — Encounter: Payer: Self-pay | Admitting: Internal Medicine

## 2013-01-08 ENCOUNTER — Ambulatory Visit (INDEPENDENT_AMBULATORY_CARE_PROVIDER_SITE_OTHER): Payer: BC Managed Care – PPO | Admitting: Internal Medicine

## 2013-01-08 VITALS — BP 152/70 | HR 92 | Temp 97.2°F | Ht 70.0 in | Wt 185.0 lb

## 2013-01-08 DIAGNOSIS — E785 Hyperlipidemia, unspecified: Secondary | ICD-10-CM

## 2013-01-08 DIAGNOSIS — H9190 Unspecified hearing loss, unspecified ear: Secondary | ICD-10-CM

## 2013-01-08 DIAGNOSIS — J309 Allergic rhinitis, unspecified: Secondary | ICD-10-CM | POA: Insufficient documentation

## 2013-01-08 DIAGNOSIS — H9192 Unspecified hearing loss, left ear: Secondary | ICD-10-CM | POA: Insufficient documentation

## 2013-01-08 DIAGNOSIS — I10 Essential (primary) hypertension: Secondary | ICD-10-CM

## 2013-01-08 NOTE — Assessment & Plan Note (Signed)
Due to wax, now improved with irrigation ,  to f/u any worsening symptoms or concerns

## 2013-01-08 NOTE — Progress Notes (Signed)
Subjective:    Patient ID: Michael Shepard, male    DOB: 11/05/1955, 57 y.o.   MRN: [id number]    HPI  here to c/o 1 wk onset left ear hearing loss, seemed worse after trying to use ear drops to help clear;  Does have several wks ongoing nasal allergy symptoms with clearish congestion, itch and sneezing, without fever, pain, ST, cough, swelling or wheezing, but with ear muffling on the right as well with popping, crackling.  Pt denies chest pain, increased sob or doe, wheezing, orthopnea, PND, increased LE swelling, palpitations, dizziness or syncope.  Had a typical migraine HA this am but now resolved, no neurological seqealae.Pt denies new neurological symptoms such as new headache, or facial or extremity weakness or numbness Past Medical History  Diagnosis Date  . Generalized anxiety disorder   . Other and unspecified hyperlipidemia   . Unspecified essential hypertension   . Esophageal reflux    Past Surgical History  Procedure Laterality Date  . Hemorrhoid surgery  1998  . Wisdom tooth extraction    . Colonoscopy  11-22-04    reports that he quit smoking about 7 years ago. He has never used smokeless tobacco. He reports that  drinks alcohol. He reports that he does not use illicit drugs. family history includes Coronary artery disease in his maternal grandfather; Diabetes in his father and other; Migraines in his mother; and Ovarian cancer in his mother.  There is no history of Prostate cancer and Colon cancer. Allergies  Allergen Reactions  . Erythromycin Nausea And Vomiting  . Penicillins Hives and Swelling    REACTION: Swelling Hives   Current Outpatient Prescriptions on File Prior to Visit  Medication Sig Dispense Refill  . losartan-hydrochlorothiazide (HYZAAR) 50-12.5 MG per tablet Take 1 tablet by mouth daily.  90 tablet  3  . omeprazole (PRILOSEC) 20 MG capsule Take 2 capsules (40 mg total) by mouth daily.  180 capsule  2  . simvastatin (ZOCOR) 40 MG tablet Take 1 tablet (40  mg total) by mouth at bedtime.  90 tablet  3   No current facility-administered medications on file prior to visit.    Review of Systems  Constitutional: Negative for unexpected weight change, or unusual diaphoresis  HENT: Negative for tinnitus.   Eyes: Negative for photophobia and visual disturbance.  Respiratory: Negative for choking and stridor.   Gastrointestinal: Negative for vomiting and blood in stool.  Genitourinary: Negative for hematuria and decreased urine volume.  Musculoskeletal: Negative for acute joint swelling Skin: Negative for color change and wound.  Neurological: Negative for tremors and numbness other than noted  Psychiatric/Behavioral: Negative for decreased concentration or  hyperactivity.       Objective:   Physical Exam BP 152/70  Pulse 92  Temp(Src) 97.2 F (36.2 C) (Oral)  Ht 5\' 10"  (1.778 m)  Wt 185 lb (83.915 kg)  BMI 26.54 kg/m2  SpO2 97% VS noted, not ill appearing Constitutional: Pt appears well-developed and well-nourished.  HENT: Head: NCAT.  Right Ear: External ear normal.  Left Ear: External ear normal.  Eyes: Conjunctivae and EOM are normal. Pupils are equal, round, and reactive to light.  Left ear wax impaction removed with irrigation, hearing improved Bilat tm's with mild erythema.  Max sinus areas mild tender.  Pharynx with mild erythema, no exudate Neck: Normal range of motion. Neck supple.  Cardiovascular: Normal rate and regular rhythm.   Pulmonary/Chest: Effort normal and breath sounds normal.  Neurological: Pt is alert. Not confused ,  motor intact Skin: Skin is warm. No erythema.  Psychiatric: Pt behavior is normal. Thought content normal. 1+ nervous    Assessment & Plan:

## 2013-01-08 NOTE — Assessment & Plan Note (Signed)
stable overall by history and exam, recent data reviewed with pt, and pt to continue medical treatment as before,  to f/u any worsening symptoms or concerns .lastld

## 2013-01-08 NOTE — Assessment & Plan Note (Signed)
For OTC allegra prn,  to f/u any worsening symptoms or concerns

## 2013-01-08 NOTE — Assessment & Plan Note (Signed)
Mild elev today likely situational, stable overall by history and exam, recent data reviewed with pt, and pt to continue medical treatment as before,  to f/u any worsening symptoms or concerns BP Readings from Last 3 Encounters:  01/08/13 152/70  02/26/12 120/80  02/25/12 136/84

## 2013-01-08 NOTE — Patient Instructions (Signed)
Your left ear was irrigated of wax today Please continue all other medications as before, and refills have been done if requested. Please also consider OTC allegra for the allergies Please have the pharmacy call with any other refills you may need. Please continue your efforts at being more active, low cholesterol diet Please continue to monitor your Blood pressure, as it was mildly elevated today; your goal is to be less than 140/90 Thank you for enrolling in MyChart. Please follow the instructions below to securely access your online medical record. MyChart allows you to send messages to your doctor, view your test results, renew your prescriptions, schedule appointments, and more To Log into My Chart online, please go by [facility] or [employer] to [email or web address], or download the MyChart App from the [employer] .  Your Username is: [email or web address] (pass [phone])

## 2013-01-08 NOTE — Assessment & Plan Note (Signed)
stable overall by history and exam, recent data reviewed with pt, and pt to continue medical treatment as before,  to f/u any worsening symptoms or concerns Lab Results  Component Value Date   LDLCALC 96 02/26/2012

## 2013-01-20 ENCOUNTER — Other Ambulatory Visit: Payer: Self-pay

## 2013-01-20 DIAGNOSIS — E785 Hyperlipidemia, unspecified: Secondary | ICD-10-CM

## 2013-01-20 MED ORDER — LOSARTAN POTASSIUM-HCTZ 50-12.5 MG PO TABS: 1.0000 | ORAL_TABLET | Freq: Every day | ORAL | Status: DC

## 2013-01-20 MED ORDER — OMEPRAZOLE 20 MG PO CPDR: 40.0000 mg | DELAYED_RELEASE_CAPSULE | Freq: Every day | ORAL | Status: DC

## 2013-01-20 MED ORDER — SIMVASTATIN 40 MG PO TABS: 40.0000 mg | ORAL_TABLET | Freq: Every day | ORAL | Status: DC

## 2013-01-27 ENCOUNTER — Other Ambulatory Visit: Payer: Self-pay

## 2013-01-27 DIAGNOSIS — E785 Hyperlipidemia, unspecified: Secondary | ICD-10-CM

## 2013-01-27 MED ORDER — LOSARTAN POTASSIUM-HCTZ 50-12.5 MG PO TABS: 1.0000 | ORAL_TABLET | Freq: Every day | ORAL | Status: DC

## 2013-02-05 ENCOUNTER — Other Ambulatory Visit (INDEPENDENT_AMBULATORY_CARE_PROVIDER_SITE_OTHER): Payer: BC Managed Care – PPO

## 2013-02-05 ENCOUNTER — Encounter: Payer: Self-pay | Admitting: Internal Medicine

## 2013-02-05 ENCOUNTER — Ambulatory Visit (INDEPENDENT_AMBULATORY_CARE_PROVIDER_SITE_OTHER): Payer: BC Managed Care – PPO | Admitting: Internal Medicine

## 2013-02-05 VITALS — BP 128/62 | HR 91 | Temp 97.9°F | Ht 70.0 in | Wt 184.0 lb

## 2013-02-05 DIAGNOSIS — N410 Acute prostatitis: Secondary | ICD-10-CM

## 2013-02-05 LAB — POCT URINALYSIS DIPSTICK
Bilirubin, UA: NEGATIVE
Glucose, UA: NEGATIVE

## 2013-02-05 LAB — URINALYSIS, ROUTINE W REFLEX MICROSCOPIC
Specific Gravity, Urine: 1.02 (ref 1.000–1.030)
Urobilinogen, UA: 4 (ref 0.0–1.0)

## 2013-02-05 MED ORDER — CIPROFLOXACIN HCL 500 MG PO TABS: 500.0000 mg | ORAL_TABLET | Freq: Two times a day (BID) | ORAL | Status: DC

## 2013-02-05 NOTE — Patient Instructions (Signed)
Prostatitis  The prostate gland is about the size and shape of a walnut. It is located just below your bladder. It produces one of the components of semen, which is made up of sperm and the fluids that help nourish and transport it out from the testicles. Prostatitis is redness, soreness, and swelling (inflammation) of the prostate gland.   There are 3 types of prostatitis:  · Acute bacterial prostatitis This is the least common type of prostatitis. It starts quickly and usually leads to a bladder infection. It can occur at any age.  · Chronic bacterial prostatitis This is a persistent bacterial infection in the prostate.  It usually develops from repeated acute bacterial prostatitis or acute bacterial prostatitis that was not properly treated. It can occur in men of any age but is most common in middle-aged men whose prostate has begun to enlarge.   · Chronic prostatitis chronic pelvic pain syndrome This is the most common type of prostatitis. It is inflammation of the prostate gland that is not caused by a bacterial infection. The cause is unknown.  CAUSES  The cause of acute and chronic bacterial prostatitis is a bacterial infection. The exact cause of chronic prostatitis and chronic pelvic pain syndrome and asymptomatic inflammatory prostatitis is unknown.   SYMPTOMS   Symptoms can vary depending upon the type of prostatitis that exists. There can also be overlap in symptoms. Possible symptoms for each type of prostatitis are listed below.  Acute bacterial prostatitis  · Painful urination.  · Fever or chills.  · Muscle or joint pains.  · Low back pain.  · Low abdominal pain.  · Inability to empty bladder completely.  · Sudden urge to urinate.  · Frequent urination.  · Difficulty starting urine stream.  · Weak urine stream.  · Discharge from the urethra.  · Dribbling after urination.  · Rectal pain.  · Pain in the testicles, penis, or tip of the penis.  · Pain in the space between the anus and scrotum  (perineum).  · Problems with sexual function.  · Painful ejaculation.  · Bloody semen.  Chronic bacterial prostatitis  · The symptoms are similar to those of acute bacterial prostatitis, but they usually are much less severe. Fever, chills, and muscle and joint pain are not associated with chronic bacterial prostatitis.  Chronic prostatitis chronic pelvic pain syndrome  · Symptoms typically include a dull ache in the scrotum and the perineum.  DIAGNOSIS   In order to diagnose prostatitis, your caregiver will ask about your symptoms. If acute or chronic bacterial prostatitis is suspected, a urine sample will be taken and tested (urinalysis). This is to see if there is bacteria in your urine. If the urinalysis result is negative for bacteria, your caregiver may use a finger to feel your prostate (digital rectal exam). This exam helps your caregiver determine if your prostate is swollen and tender.  TREATMENT   Treatment for prostatitis depends on the cause. If a bacterial infection is the cause, it can be treated with antibiotic medicine. In cases of chronic bacterial prostatitis, the use of antibiotics for up to 1 month may be necessary. Your caregiver may instruct you to take sitz baths to help relieve pain. A sitz bath is a bath of hot water in which your hips and buttocks are under water.  HOME CARE INSTRUCTIONS   · Take all medicines as directed by your caregiver.  · Take sitz baths as directed by your caregiver.  SEEK MEDICAL CARE   IF:   · Your symptoms get worse, not better.  · You have a fever.  SEEK IMMEDIATE MEDICAL CARE IF:   · You have chills.  · You feel nauseous or vomit.  · You feel lightheaded or faint.  · You are unable to urinate.  · You have blood or blood clots in your urine.  Document Released: 08/17/2000 Document Revised: 11/12/2011 Document Reviewed: 07/23/2011  [employer]® Patient Information ©2014 [employer].

## 2013-02-05 NOTE — Progress Notes (Signed)
HPI  Pt presents to the clinic today with c/o pain in his bladder area, low grad fever, burning with urination and hesitancy. This started 1 week ago. His girlfriend gave him some urostat in case he had a UTI. This did not help his symptoms. He has tried to increase his fluid intake but that has not helped either. He denies penile discharge or pain in the scrotum but he has had a little pain directly behind the scrotum. He has not had sex since the symptoms started so he does not know if there is any pain with ejaculation. He does report having a prostate infection in his early 20's. He has no history of BPH.   Review of Systems  Past Medical History  Diagnosis Date  . Generalized anxiety disorder   . Other and unspecified hyperlipidemia   . Unspecified essential hypertension   . Esophageal reflux     Family History  Problem Relation Age of Onset  . Ovarian cancer Mother   . Migraines Mother   . Diabetes Father   . Coronary artery disease Maternal Grandfather   . Prostate cancer Neg Hx   . Colon cancer Neg Hx   . Diabetes Other     History   Social History  . Marital Status: Divorced    Spouse Name: N/A    Number of Children: 0  . Years of Education: 16   Occupational History  . pulishing    Social History Main Topics  . Smoking status: Former Smoker    Quit date: 10/03/2005  . Smokeless tobacco: Never Used  . Alcohol Use: Yes     Comment: DRINKS WINE OR MIXED DRINK 3-4 TIMES PER WEEK PER PT.  . Drug Use: No  . Sexually Active: Yes -- Male partner(s)   Other Topics Concern  . Not on file   Social History Narrative   HS[employer], [employer] - [employer]. Married '86 - '11/divorced. He is in a monogamous relationship (June '13). 1 daughter - stepchild '80. 2 grandchildren but given up for adoption.  work: Systems developer for [employer]. Hobbies: hiking and outdoor activity. Reviewed: ACP - raised the issue.     Allergies  Allergen Reactions  . Erythromycin Nausea And  Vomiting  . Penicillins Hives and Swelling    REACTION: Swelling Hives    Constitutional: Pt reports low grade fever. Denies malaise, fatigue, headache or abrupt weight changes.   GU: Pt reports hesitancy, bladder pain, pain after urination. Denies burning sensation, blood in urine, odor or discharge. Skin: Denies redness, rashes, lesions or ulcercations.   No other specific complaints in a complete review of systems (except as listed in HPI above).    Objective:   Physical Exam  There were no vitals taken for this visit. Wt Readings from Last 3 Encounters:  01/08/13 185 lb (83.915 kg)  02/26/12 181 lb (82.101 kg)  02/25/12 180 lb (81.647 kg)    General: Appears his stated age, well developed, well nourished in NAD. Cardiovascular: Normal rate and rhythm. S1,S2 noted.  No murmur, rubs or gallops noted. No JVD or BLE edema. No carotid bruits noted. Pulmonary/Chest: Normal effort and positive vesicular breath sounds. No respiratory distress. No wheezes, rales or ronchi noted.  Abdomen: Soft and nontender. Normal bowel sounds, no bruits noted. No distention or masses noted. Liver, spleen and kidneys non palpable. Tender to palpation over the bladder area. No CVA tenderness. DRE shows mildly enlarged prstate, tender to palpation, no nodularity noted.  Assessment & Plan:  Acute prostatitis, new onset:  Will check urinalysis- due to urostat will have to send it to lab Will start Cipro BID x 6 weeks Drink plenty of fluids  RTC if symptoms persist or worsen or if you notice blood in your urine or increased abdominal pain   RTC as needed or if symptoms persist.

## 2013-02-05 NOTE — Addendum Note (Signed)
Addended by: Carin Primrose on: 02/05/2013 09:56 AM   Modules accepted: Orders

## 2013-03-02 ENCOUNTER — Ambulatory Visit (INDEPENDENT_AMBULATORY_CARE_PROVIDER_SITE_OTHER)
Admission: RE | Admit: 2013-03-02 | Discharge: 2013-03-02 | Disposition: A | Discharge status: Home / Self Care | Payer: BC Managed Care – PPO | Source: Ambulatory Visit | Attending: Internal Medicine | Admitting: Internal Medicine

## 2013-03-02 ENCOUNTER — Ambulatory Visit (INDEPENDENT_AMBULATORY_CARE_PROVIDER_SITE_OTHER): Payer: BC Managed Care – PPO | Admitting: Internal Medicine

## 2013-03-02 ENCOUNTER — Encounter: Payer: Self-pay | Admitting: Internal Medicine

## 2013-03-02 ENCOUNTER — Other Ambulatory Visit (INDEPENDENT_AMBULATORY_CARE_PROVIDER_SITE_OTHER): Payer: BC Managed Care – PPO

## 2013-03-02 VITALS — BP 144/70 | HR 83 | Temp 98.3°F | Ht 70.0 in | Wt 186.1 lb

## 2013-03-02 DIAGNOSIS — R310 Gross hematuria: Secondary | ICD-10-CM

## 2013-03-02 DIAGNOSIS — R319 Hematuria, unspecified: Secondary | ICD-10-CM | POA: Insufficient documentation

## 2013-03-02 DIAGNOSIS — F411 Generalized anxiety disorder: Secondary | ICD-10-CM

## 2013-03-02 DIAGNOSIS — I10 Essential (primary) hypertension: Secondary | ICD-10-CM

## 2013-03-02 LAB — URINALYSIS, ROUTINE W REFLEX MICROSCOPIC
Nitrite: NEGATIVE
Specific Gravity, Urine: 1.01 (ref 1.000–1.030)
Total Protein, Urine: NEGATIVE
pH: 7 (ref 5.0–8.0)

## 2013-03-02 NOTE — Progress Notes (Signed)
Subjective:    Patient ID: Michael Shepard, male    DOB: [birth year], 57 y.o.   MRN: [id number]  HPI  Here to f/u, c/o clot/blood one episode 3 days ago, then intermittent BRB over the last 2 dya then some BBR gross hematuria again this am; Has some mild suprapubic/LLQ pain/achiness for several days, but Denies urinary symptoms such as dysuria, frequency, urgency, flank pain, or n/v, fever, chills.  On cipro for 3 wks for acute prostatitis per NP which did improve symptoms, no prior hx of hematuria; no personal hx of stone, mother had renal stones.  No hx of GU or other malignancy. No hx of STD. Denies worsening depressive symptoms, suicidal ideation, or panic.  Pt denies chest pain, increased sob or doe, wheezing, orthopnea, PND, increased LE swelling, palpitations, dizziness or syncope. Past Medical History  Diagnosis Date  . Generalized anxiety disorder   . Other and unspecified hyperlipidemia   . Unspecified essential hypertension   . Esophageal reflux    Past Surgical History  Procedure Laterality Date  . Hemorrhoid surgery  1998  . Wisdom tooth extraction    . Colonoscopy  11-22-04    reports that he quit smoking about 7 years ago. He has never used smokeless tobacco. He reports that  drinks alcohol. He reports that he does not use illicit drugs. family history includes Coronary artery disease in his maternal grandfather; Diabetes in his father and other; Migraines in his mother; and Ovarian cancer in his mother.  There is no history of Prostate cancer and Colon cancer. Allergies  Allergen Reactions  . Erythromycin Nausea And Vomiting  . Penicillins Hives and Swelling    REACTION: Swelling Hives   Current Outpatient Prescriptions on File Prior to Visit  Medication Sig Dispense Refill  . ciprofloxacin (CIPRO) 500 MG tablet Take 1 tablet (500 mg total) by mouth 2 (two) times daily.  90 tablet  0  . losartan-hydrochlorothiazide (HYZAAR) 50-12.5 MG per tablet Take 1 tablet by mouth  daily.  90 tablet  3  . omeprazole (PRILOSEC) 20 MG capsule Take 2 capsules (40 mg total) by mouth daily.  180 capsule  3  . simvastatin (ZOCOR) 40 MG tablet Take 1 tablet (40 mg total) by mouth at bedtime.  90 tablet  3   No current facility-administered medications on file prior to visit.   Review of Systems  Constitutional: Negative for unexpected weight change, or unusual diaphoresis  HENT: Negative for tinnitus.   Eyes: Negative for photophobia and visual disturbance.  Respiratory: Negative for choking and stridor.   Gastrointestinal: Negative for vomiting and blood in stool.  Genitourinary: Negative for hematuria and decreased urine volume.  Musculoskeletal: Negative for acute joint swelling Skin: Negative for color change and wound.  Neurological: Negative for tremors and numbness other than noted  Psychiatric/Behavioral: Negative for decreased concentration or  hyperactivity.       Objective:   Physical Exam BP 144/70  Pulse 83  Temp(Src) 98.3 F (36.8 C) (Oral)  Ht 5\' 10"  (1.778 m)  Wt 186 lb 2 oz (84.426 kg)  BMI 26.71 kg/m2  SpO2 97% VS noted,  Constitutional: Pt appears well-developed and well-nourished.  HENT: Head: NCAT.  Right Ear: External ear normal.  Left Ear: External ear normal.  Eyes: Conjunctivae and EOM are normal. Pupils are equal, round, and reactive to light.  Neck: Normal range of motion. Neck supple.  Cardiovascular: Normal rate and regular rhythm.   Pulmonary/Chest: Effort normal and breath sounds normal.  Abd:  Soft, NT, non-distended, + BS- benign exam, no flank tender Neurological: Pt is alert. Not confused  Skin: Skin is warm. No erythema.  Psychiatric: Pt behavior is normal. Thought content normal. mild nervous only    Assessment & Plan:

## 2013-03-02 NOTE — Assessment & Plan Note (Signed)
stable overall by history and exam, recent data reviewed with pt, and pt to continue medical treatment as before,  to f/u any worsening symptoms or concerns Lab Results  Component Value Date   WBC 5.7 08/15/2011   HGB 13.9 10/25/2011   HCT 41.0 10/25/2011   PLT 280.0 08/15/2011   GLUCOSE 149* 10/25/2011   CHOL 175 02/26/2012   TRIG 72.0 02/26/2012   HDL 64.90 02/26/2012   LDLCALC 96 02/26/2012   ALT 92* 02/26/2012   AST 63* 02/26/2012   NA 139 10/25/2011   K 3.5 10/25/2011   CL 101 10/25/2011   CREATININE 0.80 10/25/2011   BUN 12 10/25/2011   CO2 31 08/15/2011   TSH 1.90 08/15/2011

## 2013-03-02 NOTE — Assessment & Plan Note (Signed)
Mild elev today likely situational, stable overall by history and exam, recent data reviewed with pt, and pt to continue medical treatment as before,  to f/u any worsening symptoms or concerns BP Readings from Last 3 Encounters:  03/02/13 144/70  02/05/13 128/62  01/08/13 152/70

## 2013-03-02 NOTE — Patient Instructions (Addendum)
Please finish the cipro antibiotic for now Please continue all other medications as before, and refills have been done if requested. Please have the pharmacy call with any other refills you may need. Please go to the LAB in the Basement (turn left off the elevator) for the tests to be done today- just the urine studies today You will be contacted regarding the referral for: CT abd/pelvis - to assess for stone - to see [facility] now You will be contacted regarding the referral for: urology as you should likely have the complete evaluation which most likely would include cystoscopy  Please remember to sign up for My Chart if you have not done so, as this will be important to you in the future with finding out test results, communicating by private email, and scheduling acute appointments online when needed.  Please consider Follow up with Dr Debby Bud, such as a physical in 3-6 months

## 2013-03-02 NOTE — Assessment & Plan Note (Signed)
Unclear etiology,could be related to recent prostatitis/GU infection, or stone, or even malignancy possible; to finish the cipro, does not need further pain med at this time, ok for CT abd/pelvis - r/o stone, for further urine studies, but also for referral Urology - will likely need cysto for complete evaluation

## 2013-03-03 LAB — URINE CULTURE: Colony Count: NO GROWTH

## 2013-05-26 ENCOUNTER — Ambulatory Visit (INDEPENDENT_AMBULATORY_CARE_PROVIDER_SITE_OTHER): Payer: BC Managed Care – PPO | Admitting: Internal Medicine

## 2013-05-26 ENCOUNTER — Encounter: Payer: Self-pay | Admitting: Internal Medicine

## 2013-05-26 VITALS — BP 148/70 | HR 85 | Temp 97.7°F | Wt 183.0 lb

## 2013-05-26 DIAGNOSIS — R319 Hematuria, unspecified: Secondary | ICD-10-CM

## 2013-05-26 LAB — POCT URINALYSIS DIPSTICK
Protein, UA: NEGATIVE
Spec Grav, UA: 1.01
Urobilinogen, UA: 0.2

## 2013-05-26 NOTE — Assessment & Plan Note (Signed)
Episode June 25th '14 painless hematuria, after 4 weeks of treatment with cipro for prostatitis. CT abd-pelvis normal Episode Sept 22, '14 painless hematuria of significant volume. No discomfort, dysuria, urgency or other signs of infection.  Plan Referral to Dr. Retta Diones for evaluation to rule out GU track source of hematuria.

## 2013-05-26 NOTE — Patient Instructions (Addendum)
Painless hematuria - no evidence of infection. Sounds like you passed clot.   Episode June 25th '14 painless hematuria, after 4 weeks of treatment with cipro for prostatitis. CT abd-pelvis normal Episode Sept 22, '14 painless hematuria of significant volume. No discomfort, dysuria, urgency or other signs of infection.  Plan Referral to Dr. Retta Diones for evaluation to rule out GU track source of hematuria.  Hematuria, Adult Hematuria (blood in your urine) can be caused by a bladder infection (cystitis), kidney infection (pyelonephritis), prostate infection (prostatitis), or kidney stone. Infections will usually respond to antibiotics (medications which kill germs), and a kidney stone will usually pass through your urine without further treatment. If you were put on antibiotics, take all the medicine until gone. You may feel better in a few days, but take all of your medicine or the infection may not respond and become more difficult to treat. If antibiotics were not given, an infection did not cause the blood in the urine. A further work up to find out the reason may be needed. HOME CARE INSTRUCTIONS   Drink lots of fluid, 3 to 4 quarts a day. If you have been diagnosed with an infection, cranberry juice is especially recommended, in addition to large amounts of water.  Avoid caffeine, tea, and carbonated beverages, because they tend to irritate the bladder.  Avoid alcohol as it may irritate the prostate.  Only take over-the-counter or prescription medicines for pain, discomfort, or fever as directed by your caregiver.  If you have been diagnosed with a kidney stone follow your caregivers instructions regarding straining your urine to catch the stone. TO PREVENT FURTHER INFECTIONS:  Empty the bladder often. Avoid holding urine for long periods of time.  After a bowel movement, women should cleanse front to back. Use each tissue only once.  Empty the bladder before and after sexual  intercourse if you are a male.  Return to your caregiver if you develop back pain, fever, nausea (feeling sick to your stomach), vomiting, or your symptoms (problems) are not better in 3 days. Return sooner if you are getting worse. If you have been requested to return for further testing make sure to keep your appointments. If an infection is not the cause of blood in your urine, X-rays may be required. Your caregiver will discuss this with you. SEEK IMMEDIATE MEDICAL CARE IF:   You have a persistent fever over 102 F (38.9 C).  You develop severe vomiting and are unable to keep the medication down.  You develop severe back or abdominal pain despite taking your medications.  You begin passing a large amount of blood or clots in your urine.  You feel extremely weak or faint, or pass out. MAKE SURE YOU:   Understand these instructions.  Will watch your condition.  Will get help right away if you are not doing well or get worse. Document Released: 08/20/2005 Document Revised: 11/12/2011 Document Reviewed: 04/08/2008 [facility] Patient Information 2014 [place], [place].

## 2013-05-27 NOTE — Progress Notes (Signed)
  Subjective:    Patient ID: Michael Shepard, male    DOB: 10-08-1955, 57 y.o.   MRN: [id number]  HPI Michael Shepard presents for evaluation of gross hematuria that began one day ago. He has had no pain or discomfort, no fever/chills, no dysuria, rectal pain, lower abdominal discomfort. He had a prior h/o acute prostatitis June 5th '14 treated with 6 weeks of cipro. He had hematuria June 30th with a negative evaluation and normal CT abd/pelvis (reviewed). He has not had urologic evaluation.  PMH, FamHx and SocHx reviewed for any changes and relevance.  Current Outpatient Prescriptions on File Prior to Visit  Medication Sig Dispense Refill  . ciprofloxacin (CIPRO) 500 MG tablet Take 1 tablet (500 mg total) by mouth 2 (two) times daily.  90 tablet  0  . losartan-hydrochlorothiazide (HYZAAR) 50-12.5 MG per tablet Take 1 tablet by mouth daily.  90 tablet  3  . omeprazole (PRILOSEC) 20 MG capsule Take 2 capsules (40 mg total) by mouth daily.  180 capsule  3  . simvastatin (ZOCOR) 40 MG tablet Take 1 tablet (40 mg total) by mouth at bedtime.  90 tablet  3   No current facility-administered medications on file prior to visit.      Review of Systems System review is negative for any constitutional, cardiac, pulmonary, GI or neuro symptoms or complaints other than as described in the HPI.     Objective:   Physical Exam Filed Vitals:   05/26/13 1510  BP: 148/70  Pulse: 85  Temp: 97.7 F (36.5 C)   Gen' - WNWD man in no distress Pulm - normal respirations. Abd - non tender       Assessment & Plan:

## 2013-07-09 ENCOUNTER — Other Ambulatory Visit: Payer: Self-pay

## 2013-08-03 ENCOUNTER — Other Ambulatory Visit: Payer: BC Managed Care – PPO

## 2013-08-03 ENCOUNTER — Other Ambulatory Visit: Payer: Self-pay | Admitting: Internal Medicine

## 2013-08-03 ENCOUNTER — Encounter: Payer: Self-pay | Admitting: Internal Medicine

## 2013-08-03 DIAGNOSIS — R300 Dysuria: Secondary | ICD-10-CM

## 2013-08-03 LAB — URINALYSIS, ROUTINE W REFLEX MICROSCOPIC
Bilirubin Urine: NEGATIVE
Hgb urine dipstick: NEGATIVE
Total Protein, Urine: NEGATIVE
Urine Glucose: NEGATIVE
pH: 6 (ref 5.0–8.0)

## 2014-03-10 ENCOUNTER — Encounter: Payer: Self-pay | Admitting: Internal Medicine

## 2014-05-03 ENCOUNTER — Encounter (HOSPITAL_COMMUNITY): Payer: Self-pay | Admitting: Emergency Medicine

## 2014-05-03 ENCOUNTER — Observation Stay (HOSPITAL_COMMUNITY)
Admission: EM | Admit: 2014-05-03 | Discharge: 2014-05-04 | Disposition: A | Discharge status: Home / Self Care | Payer: 59 | Attending: Cardiology | Admitting: Cardiology

## 2014-05-03 ENCOUNTER — Emergency Department (HOSPITAL_COMMUNITY): Payer: 59

## 2014-05-03 DIAGNOSIS — K219 Gastro-esophageal reflux disease without esophagitis: Secondary | ICD-10-CM | POA: Diagnosis not present

## 2014-05-03 DIAGNOSIS — E785 Hyperlipidemia, unspecified: Secondary | ICD-10-CM

## 2014-05-03 DIAGNOSIS — F411 Generalized anxiety disorder: Secondary | ICD-10-CM | POA: Diagnosis present

## 2014-05-03 DIAGNOSIS — R079 Chest pain, unspecified: Secondary | ICD-10-CM | POA: Diagnosis present

## 2014-05-03 DIAGNOSIS — R1011 Right upper quadrant pain: Secondary | ICD-10-CM | POA: Diagnosis present

## 2014-05-03 DIAGNOSIS — I10 Essential (primary) hypertension: Secondary | ICD-10-CM

## 2014-05-03 DIAGNOSIS — Q282 Arteriovenous malformation of cerebral vessels: Secondary | ICD-10-CM

## 2014-05-03 DIAGNOSIS — R0789 Other chest pain: Secondary | ICD-10-CM | POA: Diagnosis present

## 2014-05-03 HISTORY — DX: Arteriovenous malformation of cerebral vessels: Q28.2

## 2014-05-03 LAB — CBC
HEMATOCRIT: 45.3 % (ref 39.0–52.0)
HEMOGLOBIN: 15.3 g/dL (ref 13.0–17.0)
MCH: 30.9 pg (ref 26.0–34.0)
MCHC: 33.8 g/dL (ref 30.0–36.0)
MCV: 91.5 fL (ref 78.0–100.0)
Platelets: 250 10*3/uL (ref 150–400)
RBC: 4.95 MIL/uL (ref 4.22–5.81)
RDW: 12.6 % (ref 11.5–15.5)
WBC: 6.9 10*3/uL (ref 4.0–10.5)

## 2014-05-03 LAB — BASIC METABOLIC PANEL
ANION GAP: 11 (ref 5–15)
BUN: 19 mg/dL (ref 6–23)
CALCIUM: 10 mg/dL (ref 8.4–10.5)
CO2: 28 mEq/L (ref 19–32)
CREATININE: 0.93 mg/dL (ref 0.50–1.35)
Chloride: 100 mEq/L (ref 96–112)
Glucose, Bld: 102 mg/dL — ABNORMAL HIGH (ref 70–99)
Potassium: 5.4 mEq/L — ABNORMAL HIGH (ref 3.7–5.3)
SODIUM: 139 meq/L (ref 137–147)

## 2014-05-03 LAB — COMPREHENSIVE METABOLIC PANEL
ALT: 16 U/L (ref 0–53)
ANION GAP: 12 (ref 5–15)
AST: 16 U/L (ref 0–37)
Albumin: 3.6 g/dL (ref 3.5–5.2)
Alkaline Phosphatase: 55 U/L (ref 39–117)
BUN: 15 mg/dL (ref 6–23)
CO2: 25 mEq/L (ref 19–32)
Calcium: 9.1 mg/dL (ref 8.4–10.5)
Chloride: 102 mEq/L (ref 96–112)
Creatinine, Ser: 0.84 mg/dL (ref 0.50–1.35)
GFR calc non Af Amer: >90 mL/min (ref >90)
GLUCOSE: 127 mg/dL — AB (ref 70–99)
Potassium: 3.2 mEq/L — ABNORMAL LOW (ref 3.7–5.3)
SODIUM: 139 meq/L (ref 137–147)
TOTAL PROTEIN: 6.6 g/dL (ref 6.0–8.3)
Total Bilirubin: 0.4 mg/dL (ref 0.3–1.2)

## 2014-05-03 LAB — I-STAT TROPONIN, ED: TROPONIN I, POC: 0 ng/mL (ref 0.00–0.08)

## 2014-05-03 LAB — TROPONIN I
Troponin I: <0.3 ng/mL (ref <0.30)
Troponin I: <0.3 ng/mL (ref <0.30)

## 2014-05-03 LAB — PROTIME-INR
INR: 1.08 (ref 0.00–1.49)
Prothrombin Time: 14 seconds (ref 11.6–15.2)

## 2014-05-03 LAB — APTT: aPTT: 29 seconds (ref 24–37)

## 2014-05-03 MED ORDER — METOPROLOL TARTRATE 12.5 MG HALF TABLET
12.5000 mg | ORAL_TABLET | Freq: Two times a day (BID) | ORAL | Status: DC
  Administered 2014-05-03: 12.5 mg via ORAL
  Filled 2014-05-03 (×3): qty 1

## 2014-05-03 MED ORDER — ONDANSETRON HCL 4 MG/2ML IJ SOLN: 4.0000 mg | Freq: Four times a day (QID) | INTRAMUSCULAR | Status: DC | PRN

## 2014-05-03 MED ORDER — ASPIRIN EC 81 MG PO TBEC
81.0000 mg | DELAYED_RELEASE_TABLET | Freq: Every day | ORAL | Status: DC
  Filled 2014-05-03: qty 1

## 2014-05-03 MED ORDER — LOSARTAN POTASSIUM 50 MG PO TABS
50.0000 mg | ORAL_TABLET | Freq: Every day | ORAL | Status: DC
  Filled 2014-05-03: qty 1

## 2014-05-03 MED ORDER — NITROGLYCERIN 0.4 MG SL SUBL: 0.4000 mg | SUBLINGUAL_TABLET | SUBLINGUAL | Status: DC | PRN

## 2014-05-03 MED ORDER — HYDROCHLOROTHIAZIDE 12.5 MG PO CAPS
12.5000 mg | ORAL_CAPSULE | Freq: Every day | ORAL | Status: DC
  Filled 2014-05-03: qty 1

## 2014-05-03 MED ORDER — PANTOPRAZOLE SODIUM 40 MG PO TBEC: 40.0000 mg | DELAYED_RELEASE_TABLET | Freq: Every day | ORAL | Status: DC

## 2014-05-03 MED ORDER — LOSARTAN POTASSIUM-HCTZ 50-12.5 MG PO TABS: 1.0000 | ORAL_TABLET | Freq: Every day | ORAL | Status: DC

## 2014-05-03 MED ORDER — ACETAMINOPHEN 325 MG PO TABS: 650.0000 mg | ORAL_TABLET | ORAL | Status: DC | PRN

## 2014-05-03 MED ORDER — SIMVASTATIN 40 MG PO TABS
40.0000 mg | ORAL_TABLET | Freq: Every day | ORAL | Status: DC
Start: 2014-05-03 — End: 2014-05-04
  Filled 2014-05-03 (×2): qty 1

## 2014-05-03 MED ORDER — ASPIRIN 325 MG PO TABS
325.0000 mg | ORAL_TABLET | Freq: Once | ORAL | Status: AC
  Administered 2014-05-03: 325 mg via ORAL
  Filled 2014-05-03: qty 1

## 2014-05-03 NOTE — H&P (Addendum)
Admit date: 05/03/2014 Referring Physician:  Dr. Venora Maples Primary Cardiologist:  None Chief complaint/reason for admission:Chest pain  HPI: This is a 58yo WM with a history of GERD, generalized anxiety d/o, HTN and dyslipidemia who has been in his USOH until last Friday night late at night while sitting on the couch.  He felt weak and trembly and had some tightness in his chest.  He had had 3 cups of coffee earlier that day.  He took a migraine pill that had caffeine in it and then had another cup of caffeinated coffee. On Sat and Sunday he had a mild intermittent pressure that would stay for a while and go away.  Today, while sitting at his desk he developed a sharp clenching pain deep in his chest as well as pressure.  He says that his left arm has been hurting but he attributed that to working on his computer but the arm pain has been occurring for a while.  He has felt fatigued but no SOB.  He denies any nausea or diaphoresis.  He normally hikes and walks at least once weekly up to 4 times usually a minumum of a mile and up to 6-8 miles and has never had any symptoms.  He actually hiked yesterday while he had the mild low grade pressure but he chest discomfort was not exacerbated with it.  He presented to the ER today because the chest discomfort was different with a new clenching pain that was more severe.  His initial cardiac enzymes are normal and EKG is nonischemic.  Currently he has no pain but still feels a vague pressure.  Cardiology is now asked to admit for further evaluation.    PMH:    Past Medical History  Diagnosis Date  . Generalized anxiety disorder   . Other and unspecified hyperlipidemia   . Unspecified essential hypertension   . Esophageal reflux   . Cerebral AVM     PSH:    Past Surgical History  Procedure Laterality Date  . Hemorrhoid surgery  1998  . Wisdom tooth extraction    . Colonoscopy  11-22-04    ALLERGIES:   Erythromycin and Penicillins  Prior to Admit Meds:     (Not in a hospital admission) Family HX:    Family History  Problem Relation Age of Onset  . Ovarian cancer Mother   . Migraines Mother   . Diabetes Father   . Coronary artery disease Maternal Grandfather   . Prostate cancer Neg Hx   . Colon cancer Neg Hx   . Diabetes Other    Social HX:    History   Social History  . Marital Status: Divorced    Spouse Name: N/A    Number of Children: 0  . Years of Education: 70   Occupational History  . pulishing    Social History Main Topics  . Smoking status: Former Smoker    Quit date: 10/03/2005  . Smokeless tobacco: Never Used  . Alcohol Use: Yes     Comment: DRINKS WINE OR MIXED DRINK 3-4 TIMES PER WEEK PER PT.  . Drug Use: No  . Sexual Activity: Yes    Partners: Female   Other Topics Concern  . Not on file   Social History Narrative   HSG, Cathlean Marseilles - BA. Married '86 - '11/divorced. He is in a monogamous relationship (June '13). 1 daughter - stepchild '80. 2 grandchildren but given up for adoption.  work: Surveyor, quantity for [employer]. Hobbies: hiking and  outdoor activity. Reviewed: ACP - raised the issue.      ROS:  All 11 ROS were addressed and are negative except what is stated in the HPI  PHYSICAL EXAM Filed Vitals:   05/03/14 1415  BP: 143/79  Pulse: 73  Temp:   Resp: 14   General: Well developed, well nourished, in no acute distress Head: Eyes PERRLA, No xanthomas.   Normal cephalic and atramatic  Lungs:   Clear bilaterally to auscultation and percussion. Heart:   HRRR S1 S2 Pulses are 2+ & equal.            No carotid bruit. No JVD.  No abdominal bruits. No femoral bruits. Abdomen: Bowel sounds are positive, abdomen soft and non-tender without masses Msk:  Back normal, normal gait. Normal strength and tone for age. Extremities:   No clubbing, cyanosis or edema.  DP +1 Neuro: Alert and oriented X 3. Psych:  Good affect, responds appropriately   Labs:   Lab Results  Component Value Date   WBC 6.9  05/03/2014   HGB 15.3 05/03/2014   HCT 45.3 05/03/2014   MCV 91.5 05/03/2014   PLT 250 05/03/2014     Recent Labs Lab 05/03/14 1347  NA 139  K 5.4*  CL 100  CO2 28  BUN 19  CREATININE 0.93  CALCIUM 10.0  GLUCOSE 102*   No results found for this basename: CKTOTAL,  CKMB,  CKMBINDEX,  TROPONINI   No results found for this basename: PTT   No results found for this basename: INR,  PROTIME     Lab Results  Component Value Date   CHOL 175 02/26/2012   CHOL 193 09/18/2007   Lab Results  Component Value Date   HDL 64.90 02/26/2012   HDL 42.8 09/18/2007   Lab Results  Component Value Date   LDLCALC 96 02/26/2012   LDLCALC 123* 09/18/2007   Lab Results  Component Value Date   TRIG 72.0 02/26/2012   TRIG 134 09/18/2007   Lab Results  Component Value Date   CHOLHDL 3 02/26/2012   CHOLHDL 4.5 CALC 09/18/2007   No results found for this basename: LDLDIRECT      Radiology:  No results found.  EKG:  NSR with no ST changes and normal intervals  ASSESSMENT:  1.  Chest pain which is rather atypical in nature.  He was able to do a hike yesterday with no exacerbation of his underlying low grade pressure.  The pain that brought him to the ER today was different and was a sharp clenching pain.  He currently is pain free with normal EKG and normal troponin.  His CRF includes male sex, age > 15, HTN and dyslipidemia.  He has a history of pretty significant GERD.  He has also been under a lot of stress with his month who has some medical problems. 2.  HTN well controlled 3.  Dyslipidemia 4.  GERD  PLAN:   1.  Admit for 23 hour obs - tele bed 2.  Cycle cardiac enzymes 3.  Will hold off on anticoagulation due to history of cerebral AVM with ? Very small bleeding in 2012 unless enzymes become positive 4.  2D echo to assess LVF 5.  Continue statin and add ASA 81mg  daily 6.  Continue BP meds 7.  NPO after MN 8.  Stress myoview in am to rule out ischemia  Sueanne Margarita, MD  05/03/2014    3:34 PM

## 2014-05-03 NOTE — ED Notes (Signed)
AWAITING [facility] FOR TRANSPORT

## 2014-05-03 NOTE — ED Notes (Signed)
Patient updated on transport wait.

## 2014-05-03 NOTE — ED Notes (Signed)
Pt c/o left chest pain that started about an hour ago while working on computer at his office. Pt states he did have weakness, dizziness, lightheadedness, and some left arm pain.  Pt denies n/v.  Pt also states had little pain on Friday and thought related to his migraines.

## 2014-05-03 NOTE — ED Notes (Signed)
Family at bedside. 

## 2014-05-03 NOTE — ED Provider Notes (Signed)
CSN: [id number]     Arrival date & time 05/03/14  1202 History   First MD Initiated Contact with Patient 05/03/14 1352     Chief Complaint  Patient presents with  . Chest Pain      HPI Patient reports intermittent chest tightness and pressure over the weekend.  His had generalized fatigue.  He denies exertional chest pain.  Today's episode occurred while at rest and felt like a pressure in the left side of his chest.  His had some shortness of breath but none today.  Denies nausea and vomiting.  No history of cardiac disease.  Denies abdominal pain.  No rash of his chest.  No tenderness of his chest.  No pleuritic component to his chest discomfort.  He has hypertension, hyperlipidemia, family history of heart disease.  He has a prior tobacco abuse history.  He's never had chest pain like this before.   Past Medical History  Diagnosis Date  . Generalized anxiety disorder   . Other and unspecified hyperlipidemia   . Unspecified essential hypertension   . Esophageal reflux    Past Surgical History  Procedure Laterality Date  . Hemorrhoid surgery  1998  . Wisdom tooth extraction    . Colonoscopy  11-22-04   Family History  Problem Relation Age of Onset  . Ovarian cancer Mother   . Migraines Mother   . Diabetes Father   . Coronary artery disease Maternal Grandfather   . Prostate cancer Neg Hx   . Colon cancer Neg Hx   . Diabetes Other    History  Substance Use Topics  . Smoking status: Former Smoker    Quit date: 10/03/2005  . Smokeless tobacco: Never Used  . Alcohol Use: Yes     Comment: DRINKS WINE OR MIXED DRINK 3-4 TIMES PER WEEK PER PT.    Review of Systems  All other systems reviewed and are negative.     Allergies  Erythromycin and Penicillins  Home Medications   Prior to Admission medications   Medication Sig Start Date End Date Taking? Authorizing Provider  losartan-hydrochlorothiazide (HYZAAR) 50-12.5 MG per tablet Take 1 tablet by mouth daily.   Yes  Historical Provider, MD  omeprazole (PRILOSEC) 20 MG capsule Take 40 mg by mouth daily.   Yes Historical Provider, MD  simvastatin (ZOCOR) 40 MG tablet Take 40 mg by mouth at bedtime.   Yes Historical Provider, MD   BP 143/79  Pulse 73  Temp(Src) 97.6 F (36.4 C) (Oral)  Resp 14  SpO2 100% Physical Exam  Nursing note and vitals reviewed. Constitutional: He is oriented to person, place, and time. He appears well-developed and well-nourished.  HENT:  Head: Normocephalic and atraumatic.  Eyes: EOM are normal.  Neck: Normal range of motion.  Cardiovascular: Normal rate, regular rhythm, normal heart sounds and intact distal pulses.   Pulmonary/Chest: Effort normal and breath sounds normal. No respiratory distress.  Abdominal: Soft. He exhibits no distension. There is no tenderness.  Musculoskeletal: Normal range of motion.  Neurological: He is alert and oriented to person, place, and time.  Skin: Skin is warm and dry.  Psychiatric: He has a normal mood and affect. Judgment normal.    ED Course  Procedures (including critical care time) Labs Review Labs Reviewed  BASIC METABOLIC PANEL - Abnormal; Notable for the following:    Potassium 5.4 (*)    Glucose, Bld 102 (*)    All other components within normal limits  CBC  TROPONIN I  I-STAT  TROPOININ, ED    Imaging Review Dg Chest Port 1 View  05/03/2014   CLINICAL DATA:  Mid chest pain.  EXAM: PORTABLE CHEST - 1 VIEW  COMPARISON:  08/02/2008.  FINDINGS: The lungs are clear without focal infiltrate, edema, pneumothorax or pleural effusion. The cardiopericardial silhouette is within normal limits for size. Imaged bony structures of the thorax are intact. Telemetry leads overlie the chest.  IMPRESSION: Stable.  No acute cardiopulmonary findings.   Electronically Signed   By: Misty Stanley M.D.   On: 05/03/2014 12:39     EKG Interpretation   Date/Time:  Monday May 03 2014 12:14:26 EDT Ventricular Rate:  79 PR Interval:  165 QRS  Duration: 102 QT Interval:  392 QTC Calculation: 449 R Axis:   82 Text Interpretation:  Sinus rhythm Baseline wander in lead(s) I III aVL No  old tracing to compare Confirmed by Syona Wroblewski  MD, Lennette Bihari ([id number]) on 05/03/2014  2:27:46 PM      MDM   Final diagnoses:  Chest pain, unspecified chest pain type  Gastroesophageal reflux disease without esophagitis  Hyperlipidemia  Unspecified essential hypertension    Both typical and atypical components.  Heart score of 4.  Have asked cardiology to evaluate the patient is a may benefit from serial enzymes and stress testing in the hospital.    Hoy Morn, MD 05/03/14 [phone]

## 2014-05-04 ENCOUNTER — Observation Stay (HOSPITAL_COMMUNITY): Payer: 59

## 2014-05-04 DIAGNOSIS — I10 Essential (primary) hypertension: Secondary | ICD-10-CM | POA: Diagnosis not present

## 2014-05-04 DIAGNOSIS — I517 Cardiomegaly: Secondary | ICD-10-CM

## 2014-05-04 DIAGNOSIS — E785 Hyperlipidemia, unspecified: Secondary | ICD-10-CM | POA: Diagnosis not present

## 2014-05-04 DIAGNOSIS — R079 Chest pain, unspecified: Secondary | ICD-10-CM

## 2014-05-04 DIAGNOSIS — K219 Gastro-esophageal reflux disease without esophagitis: Secondary | ICD-10-CM | POA: Diagnosis not present

## 2014-05-04 DIAGNOSIS — R072 Precordial pain: Secondary | ICD-10-CM

## 2014-05-04 LAB — TROPONIN I

## 2014-05-04 LAB — BASIC METABOLIC PANEL
Anion gap: 10 (ref 5–15)
BUN: 15 mg/dL (ref 6–23)
CALCIUM: 9.1 mg/dL (ref 8.4–10.5)
CO2: 28 meq/L (ref 19–32)
CREATININE: 0.94 mg/dL (ref 0.50–1.35)
Chloride: 105 mEq/L (ref 96–112)
GFR calc Af Amer: >90 mL/min (ref >90)
GFR calc non Af Amer: >90 mL/min (ref >90)
GLUCOSE: 135 mg/dL — AB (ref 70–99)
Potassium: 3.6 mEq/L — ABNORMAL LOW (ref 3.7–5.3)
Sodium: 143 mEq/L (ref 137–147)

## 2014-05-04 LAB — CBC
HEMATOCRIT: 39 % (ref 39.0–52.0)
Hemoglobin: 13.7 g/dL (ref 13.0–17.0)
MCH: 31.4 pg (ref 26.0–34.0)
MCHC: 35.1 g/dL (ref 30.0–36.0)
MCV: 89.2 fL (ref 78.0–100.0)
Platelets: 228 10*3/uL (ref 150–400)
RBC: 4.37 MIL/uL (ref 4.22–5.81)
RDW: 12.5 % (ref 11.5–15.5)
WBC: 5.5 10*3/uL (ref 4.0–10.5)

## 2014-05-04 LAB — LIPID PANEL
Cholesterol: 236 mg/dL — ABNORMAL HIGH (ref 0–200)
HDL: 53 mg/dL (ref >39)
LDL CALC: 141 mg/dL — AB (ref 0–99)
Total CHOL/HDL Ratio: 4.5 RATIO
Triglycerides: 212 mg/dL — ABNORMAL HIGH (ref <150)
VLDL: 42 mg/dL — ABNORMAL HIGH (ref 0–40)

## 2014-05-04 MED ORDER — TECHNETIUM TC 99M SESTAMIBI GENERIC - CARDIOLITE
10.0000 | Freq: Once | INTRAVENOUS | Status: AC | PRN
  Administered 2014-05-04: 10 via INTRAVENOUS

## 2014-05-04 MED ORDER — METOPROLOL TARTRATE 12.5 MG HALF TABLET: 12.5000 mg | ORAL_TABLET | Freq: Two times a day (BID) | ORAL | Status: DC

## 2014-05-04 MED ORDER — TECHNETIUM TC 99M SESTAMIBI - CARDIOLITE
30.0000 | Freq: Once | INTRAVENOUS | Status: AC | PRN
  Administered 2014-05-04: 30 via INTRAVENOUS

## 2014-05-04 NOTE — Progress Notes (Signed)
Informed Tenny Craw, PA that pt had his Lopressor 12.5 mg at 2139 last evening.  Walking treadmill is ordered with target HR 139 - Samara Snide says we will try the walking initially and may have to switch to [place] if can't get to target HR in a reasonable time.

## 2014-05-04 NOTE — Progress Notes (Signed)
Echocardiogram 2D Echocardiogram has been performed.  Michael Shepard M 05/04/2014, 1:15 PM

## 2014-05-04 NOTE — Discharge Summary (Signed)
See progress notes Brian Crenshaw  

## 2014-05-04 NOTE — Discharge Summary (Signed)
Discharge Summary   Patient ID: Michael Shepard,  MRN: [id number], DOB/AGE: 1955/09/11 58 y.o.  Admit date: 05/03/2014 Discharge date: 05/04/2014  Primary Care Provider: Adella Hare Primary Cardiologist: New (seen by Dr. Radford Pax during this admission)  Discharge Diagnoses Principal Problem:   Atypical chest pain  - negative nuc, echo EF 24-26%, grade 1 diastolic dysfunction, mild LVH  Active Problems:   Generalized anxiety disorder   HYPERTENSION   GERD   Abdominal pain, right upper quadrant   Hyperlipidemia   Cerebral AVM   Allergies Allergies  Allergen Reactions  . Erythromycin Nausea And Vomiting  . Penicillins Hives and Swelling    REACTION: Swelling Hives    Procedures  Echocardiogram LV EF: 55% - 60%  ------------------------------------------------------------------- Indications: Chest pain 786.51.  ------------------------------------------------------------------- History: Risk factors: Hypertension. Dyslipidemia.  ------------------------------------------------------------------- Study Conclusions  - Left ventricle: The cavity size was normal. Wall thickness was increased in a pattern of mild LVH. Systolic function was normal. The estimated ejection fraction was in the range of 55% to 60%. Wall motion was normal; there were no regional wall motion abnormalities. Doppler parameters are consistent with abnormal left ventricular relaxation (grade 1 diastolic dysfunction). The E/e&' ratio is <8, suggesting normal LV filling pressure. - Left atrium: The atrium was normal in size.  Impressions:  - LVEF 55-60%, mild LVH, normal LA size, normal wall motion, mild diastolic dysfunction with normal LA filling pressure.     Stress test FINDINGS:  Perfusion: No decreased activity in the left ventricle on stress  imaging to suggest reversible ischemia or infarction. Incidental  mild diaphragmatic attenuation.  Wall Motion: Normal left ventricular wall  motion. No left  ventricular dilation.  Left Ventricular Ejection Fraction: 83% %  End diastolic volume 92 ml  End systolic volume 27 ml  IMPRESSION:  1. No reversible ischemia or infarction.  2. Normal left ventricular wall motion.  3. Left ventricular ejection fraction 71%%  4. Low-risk stress test findings*.    Hospital Course  The patient is a 58 year old male with history of hypertension, hyperlipidemia, GERD, history of AVM, and generalized anxiety disorder who presented to [facility] [place] on 05/03/2014 with intermittent chest discomfort for the past 2 days. On the day of admission, while sitting at his desk, he developed a sharp clenching pain deep inside his chest as well as pressure. According to the patient, he has been under a great deal of stress due to medical condition of his mother and also has drank 3 cups of coffee in the morning of admission. He appears to be quite active at home and usually go hiking 1-4 times each week up to 6-8 miles without any exertional symptoms. Interestingly, patient was hiking the day prior to admission, he had a mild low-grade chest pressure at the time, however exertion did not seem to exacerbate it. His chest pain was felt to be atypical  Patient was admitted overnight for observation. Serial troponins were negative. He underwent a Myoview stress test in the morning of 05/04/2014 which showed EF 71%, no reversible ischemia or infarction. The test was low risk study overall. Echocardiogram was obtained on the same day which showed EF 55-60%, mild LVH, grade 1 diastolic dysfunction. Patient was seen post stress test at which time he denies any chest discomfort or shortness breath. He felt he has been under a lot of stress in the recent days. He is deemed stable for discharge to followup with his primary care physician. However if he does have recurrent symptom,  patient can potentially followup with Dr. Radford Pax on an as needed basis.   He was briefly started  on metoprolol during admission in case he had coronary artery disease, metoprolol has been discontinued prior to discharge.   Discharge Vitals Blood pressure 127/73, pulse 76, temperature 97.7 F (36.5 C), temperature source Oral, resp. rate 18, height 5\' 10"  (1.778 m), weight 185 lb 3 oz (84 kg), SpO2 97.00%.  Filed Weights   05/03/14 1924 05/04/14 0610  Weight: 185 lb (83.915 kg) 185 lb 3 oz (84 kg)    Labs  CBC  Recent Labs  05/03/14 1220 05/04/14 0110  WBC 6.9 5.5  HGB 15.3 13.7  HCT 45.3 39.0  MCV 91.5 89.2  PLT 250 517   Basic Metabolic Panel  Recent Labs  05/03/14 2048 05/04/14 0110  NA 139 143  K 3.2* 3.6*  CL 102 105  CO2 25 28  GLUCOSE 127* 135*  BUN 15 15  CREATININE 0.84 0.94  CALCIUM 9.1 9.1   Liver Function Tests  Recent Labs  05/03/14 2048  AST 16  ALT 16  ALKPHOS 55  BILITOT 0.4  PROT 6.6  ALBUMIN 3.6   Cardiac Enzymes  Recent Labs  05/03/14 2048 05/04/14 0110 05/04/14 1150  TROPONINI <0.30 <0.30 <0.30   Fasting Lipid Panel  Recent Labs  05/04/14 0110  CHOL 236*  HDL 53  LDLCALC 141*  TRIG 212*  CHOLHDL 4.5    Disposition  Pt is being discharged home today in good condition.  Follow-up Plans & Appointments      Follow-up Information   Please follow up. (Follow up with your PCP as needed. )       Follow up with Sueanne Margarita, MD. (schedule follow up with Dr. Radford Pax on an as needed basis if have recurrent symptom. Otherwise follow up with primary care provider)    Specialty:  Cardiology   Contact information:   [address] [address] [phone]       Discharge Medications    Medication List         losartan-hydrochlorothiazide 50-12.5 MG per tablet  Commonly known as:  HYZAAR  Take 1 tablet by mouth daily.     omeprazole 20 MG capsule  Commonly known as:  PRILOSEC  Take 40 mg by mouth daily.     simvastatin 40 MG tablet  Commonly known as:  ZOCOR  Take 40 mg by  mouth at bedtime.         Duration of Discharge Encounter   Greater than 30 minutes including physician time.  Hilbert Corrigan PA-C Pager: [phone] 05/04/2014, 5:14 PM

## 2014-05-04 NOTE — Progress Notes (Signed)
Subjective: No further CP  Objective: Vital signs in last 24 hours: Temp:  [97.6 F (36.4 C)-98.3 F (36.8 C)] 97.8 F (36.6 C) (09/01 0610) Pulse Rate:  [66-91] 66 (09/01 0610) Resp:  [10-18] 18 (09/01 0610) BP: (113-159)/(71-101) 113/71 mmHg (09/01 0610) SpO2:  [96 %-100 %] 96 % (09/01 0610) Weight:  [185 lb (83.915 kg)-185 lb 3 oz (84 kg)] 185 lb 3 oz (84 kg) (09/01 0610) Last BM Date: 05/04/14  Intake/Output from previous day:   Intake/Output this shift:    Medications Current Facility-Administered Medications  Medication Dose Route Frequency Provider Last Rate Last Dose  . acetaminophen (TYLENOL) tablet 650 mg  650 mg Oral Q4H PRN Sueanne Margarita, MD      . aspirin EC tablet 81 mg  81 mg Oral Daily Sueanne Margarita, MD      . losartan (COZAAR) tablet 50 mg  50 mg Oral Daily Sueanne Margarita, MD       And  . hydrochlorothiazide (MICROZIDE) capsule 12.5 mg  12.5 mg Oral Daily Sueanne Margarita, MD      . metoprolol tartrate (LOPRESSOR) tablet 12.5 mg  12.5 mg Oral BID Sueanne Margarita, MD   12.5 mg at 05/03/14 2139  . nitroGLYCERIN (NITROSTAT) SL tablet 0.4 mg  0.4 mg Sublingual Q5 Min x 3 PRN Sueanne Margarita, MD      . ondansetron (ZOFRAN) injection 4 mg  4 mg Intravenous Q6H PRN Sueanne Margarita, MD      . pantoprazole (PROTONIX) EC tablet 40 mg  40 mg Oral Daily Sueanne Margarita, MD      . simvastatin (ZOCOR) tablet 40 mg  40 mg Oral QHS Sueanne Margarita, MD        PE: General appearance: alert, cooperative and no distress Lungs: clear to auscultation bilaterally Heart: regular rate and rhythm, S1, S2 normal, no murmur, click, rub or gallop Extremities: No LEE Pulses: 2+ and symmetric Skin: Warm and dry Neurologic: Grossly normal  Lab Results:   Recent Labs  05/03/14 1220 05/04/14 0110  WBC 6.9 5.5  HGB 15.3 13.7  HCT 45.3 39.0  PLT 250 228   BMET  Recent Labs  05/03/14 1347 05/03/14 2048 05/04/14 0110  NA 139 139 143  K 5.4* 3.2* 3.6*  CL 100 102 105  CO2  28 25 28   GLUCOSE 102* 127* 135*  BUN 19 15 15   CREATININE 0.93 0.84 0.94  CALCIUM 10.0 9.1 9.1   PT/INR  Recent Labs  05/03/14 2048  LABPROT 14.0  INR 1.08   Cholesterol  Recent Labs  05/04/14 0110  CHOL 236*   Lipid Panel     Component Value Date/Time   CHOL 236* 05/04/2014 0110   TRIG 212* 05/04/2014 0110   HDL 53 05/04/2014 0110   CHOLHDL 4.5 05/04/2014 0110   VLDL 42* 05/04/2014 0110   LDLCALC 141* 05/04/2014 0110   Cardiac Panel (last 3 results)  Recent Labs  05/03/14 1455 05/03/14 2048 05/04/14 0110  TROPONINI <0.30 <0.30 <0.30    Assessment/Plan    Active Problems:   Chest pain   Dyslipidemia  Plan.  Ruled out for MI.  Treadmill cardiolite which he tolerated very well.  On zocor.  BP and HR stable.  If nuc negative stop BB.       LOS: 1 day    HAGER, BRYAN PA-C 05/04/2014 9:02 AM As above; no chest pain; enzymes negative; await nuclear results and echo; if  negative patient can be DCed today and fu with primary care. Will DC metoprolol if above evaluation negative. >30 min PA and physician time D2 Kirk Ruths

## 2014-05-04 NOTE — Discharge Instructions (Signed)

## 2014-05-04 NOTE — Progress Notes (Signed)
UR completed 

## 2014-07-20 ENCOUNTER — Encounter: Payer: Self-pay | Admitting: Internal Medicine

## 2014-07-20 ENCOUNTER — Ambulatory Visit (INDEPENDENT_AMBULATORY_CARE_PROVIDER_SITE_OTHER): Payer: 59 | Admitting: Internal Medicine

## 2014-07-20 VITALS — BP 130/70 | HR 73 | Temp 98.1°F | Resp 12 | Ht 70.0 in | Wt 186.2 lb

## 2014-07-20 DIAGNOSIS — M791 Myalgia: Secondary | ICD-10-CM

## 2014-07-20 DIAGNOSIS — E785 Hyperlipidemia, unspecified: Secondary | ICD-10-CM

## 2014-07-20 DIAGNOSIS — IMO0001 Reserved for inherently not codable concepts without codable children: Secondary | ICD-10-CM

## 2014-07-20 DIAGNOSIS — K219 Gastro-esophageal reflux disease without esophagitis: Secondary | ICD-10-CM

## 2014-07-20 DIAGNOSIS — I10 Essential (primary) hypertension: Secondary | ICD-10-CM

## 2014-07-20 DIAGNOSIS — M609 Myositis, unspecified: Secondary | ICD-10-CM

## 2014-07-20 DIAGNOSIS — Z0000 Encounter for general adult medical examination without abnormal findings: Secondary | ICD-10-CM

## 2014-07-20 MED ORDER — SIMVASTATIN 40 MG PO TABS
40.0000 mg | ORAL_TABLET | Freq: Every day | ORAL | Status: DC
Start: 2014-07-20 — End: 2015-07-09

## 2014-07-20 MED ORDER — LOSARTAN POTASSIUM-HCTZ 50-12.5 MG PO TABS: 1.0000 | ORAL_TABLET | Freq: Every day | ORAL | Status: DC

## 2014-07-20 MED ORDER — OMEPRAZOLE 20 MG PO CPDR: 40.0000 mg | DELAYED_RELEASE_CAPSULE | Freq: Every day | ORAL | Status: DC

## 2014-07-20 NOTE — Assessment & Plan Note (Signed)
Likely a muscular strain causing pain. Advised heat or ice when he gets the pains. He can take Tylenol or ibuprofen. If he is consistently having pains will call us back and we can always call and Voltaren gel.

## 2014-07-20 NOTE — Assessment & Plan Note (Signed)
He requires his Prilosec daily otherwise he has acid reflux symptoms.

## 2014-07-20 NOTE — Progress Notes (Signed)
   Subjective:    Patient ID: Michael Shepard, male    DOB: 06-05-1956, 58 y.o.   MRN: [id number]  HPI The patient is a 58 YO man who comes in today to establish care. He has PMH of anxiety, cerebral avm, htn, hyperlipidemia. He does fairly well overall but has noticed a new problem in the past several weeks. He has a spot on his back where he gets muscular pain. He does not feel it related to activity although he occasionally feels it pulling. He does do hiking occasionally with backpack and does not notice that this did affects the pain. He is able to take ibuprofen which does not help much however he is able to go about his day without too much inconvenience from the pain. He denies any chest pain, shortness of breath, abdominal pain, nausea, vomiting, diarrhea, constipation.  Review of Systems  Constitutional: Negative for fever, activity change, appetite change, fatigue and unexpected weight change.  HENT: Negative.   Eyes: Negative.   Respiratory: Negative for cough, chest tightness, shortness of breath and wheezing.   Cardiovascular: Negative for chest pain, palpitations and leg swelling.  Gastrointestinal: Negative for abdominal pain, diarrhea, constipation and abdominal distention.  Musculoskeletal: Positive for back pain. Negative for myalgias, arthralgias, gait problem, neck pain and neck stiffness.  Skin: Negative.   Neurological: Negative for dizziness, weakness, light-headedness and headaches.  Psychiatric/Behavioral: Negative.       Objective:   Physical Exam  Constitutional: He is oriented to person, place, and time. He appears well-developed and well-nourished. No distress.  HENT:  Head: Normocephalic and atraumatic.  Eyes: EOM are normal.  Neck: Normal range of motion.  Cardiovascular: Normal rate and regular rhythm.   No murmur heard. Pulmonary/Chest: Effort normal and breath sounds normal. No respiratory distress. He has no wheezes. He has no rales.  Abdominal: Soft.  Bowel sounds are normal. He exhibits no distension. There is no tenderness. There is no rebound.  Musculoskeletal: He exhibits no edema or tenderness.  No tenderness currently but spot where he was having pain in muscles right below rib cage left mid back without radiation.  Neurological: He is alert and oriented to person, place, and time. Coordination normal.  Skin: Skin is warm and dry. No rash noted. No erythema.   Filed Vitals:   07/20/14 0804  BP: 130/70  Pulse: 73  Temp: 98.1 F (36.7 C)  TempSrc: Oral  Resp: 12  Height: 5\' 10"  (1.778 m)  Weight: 186 lb 3.2 oz (84.46 kg)  SpO2: 92%      Assessment & Plan:

## 2014-07-20 NOTE — Assessment & Plan Note (Signed)
Patiently currently doing well on losartan HCTZ. Reviewed recent basic metabolic panel which was normal.

## 2014-07-20 NOTE — Patient Instructions (Addendum)
We do not need to take any blood work today.  For the back pain I would recommend heating pad for the area or to try tylenol. If it comes up and is bothering you for several days in a row we could try a pain relief cream called voltaren which should help.  Come back in about 1 year for your check up. Please feel free to call us sooner if you have any problems or questions.

## 2014-07-20 NOTE — Assessment & Plan Note (Signed)
Colonoscopy due 2013, up-to-date on health maintenance. Has already had flu shot.

## 2014-07-20 NOTE — Progress Notes (Signed)
Pre visit review using our clinic review tool, if applicable. No additional management support is needed unless otherwise documented below in the visit note. 

## 2014-07-20 NOTE — Assessment & Plan Note (Signed)
Continue Zocor, recent lipid panel reviewed and normal.

## 2014-11-15 ENCOUNTER — Other Ambulatory Visit: Payer: Self-pay | Admitting: Internal Medicine

## 2014-12-29 ENCOUNTER — Other Ambulatory Visit: Payer: Self-pay | Admitting: Internal Medicine

## 2015-03-22 ENCOUNTER — Other Ambulatory Visit: Payer: Self-pay | Admitting: Internal Medicine

## 2015-05-30 ENCOUNTER — Other Ambulatory Visit (INDEPENDENT_AMBULATORY_CARE_PROVIDER_SITE_OTHER): Payer: 59

## 2015-05-30 ENCOUNTER — Encounter: Payer: Self-pay | Admitting: Family

## 2015-05-30 ENCOUNTER — Ambulatory Visit (INDEPENDENT_AMBULATORY_CARE_PROVIDER_SITE_OTHER): Payer: 59 | Admitting: Family

## 2015-05-30 VITALS — BP 140/84 | HR 86 | Temp 98.2°F | Resp 18 | Ht 70.0 in | Wt 192.0 lb

## 2015-05-30 DIAGNOSIS — R319 Hematuria, unspecified: Secondary | ICD-10-CM | POA: Diagnosis not present

## 2015-05-30 LAB — POCT URINALYSIS DIPSTICK
Bilirubin, UA: NEGATIVE
Glucose, UA: NEGATIVE
Ketones, UA: NEGATIVE
Leukocytes, UA: NEGATIVE
NITRITE UA: NEGATIVE
PROTEIN UA: NEGATIVE
SPEC GRAV UA: 1.025
UROBILINOGEN UA: NEGATIVE
pH, UA: 6

## 2015-05-30 LAB — CBC WITH DIFFERENTIAL/PLATELET
BASOS PCT: 0.3 % (ref 0.0–3.0)
Basophils Absolute: 0 10*3/uL (ref 0.0–0.1)
EOS PCT: 1.8 % (ref 0.0–5.0)
Eosinophils Absolute: 0.1 10*3/uL (ref 0.0–0.7)
HEMATOCRIT: 44.1 % (ref 39.0–52.0)
HEMOGLOBIN: 14.9 g/dL (ref 13.0–17.0)
LYMPHS PCT: 25.2 % (ref 12.0–46.0)
Lymphs Abs: 1.8 10*3/uL (ref 0.7–4.0)
MCHC: 33.9 g/dL (ref 30.0–36.0)
MCV: 90.4 fl (ref 78.0–100.0)
Monocytes Absolute: 0.5 10*3/uL (ref 0.1–1.0)
Monocytes Relative: 6.7 % (ref 3.0–12.0)
Neutro Abs: 4.7 10*3/uL (ref 1.4–7.7)
Neutrophils Relative %: 66 % (ref 43.0–77.0)
Platelets: 302 10*3/uL (ref 150.0–400.0)
RBC: 4.87 Mil/uL (ref 4.22–5.81)
RDW: 13.1 % (ref 11.5–15.5)
WBC: 7.2 10*3/uL (ref 4.0–10.5)

## 2015-05-30 LAB — URINALYSIS, ROUTINE W REFLEX MICROSCOPIC
Bilirubin Urine: NEGATIVE
Ketones, ur: NEGATIVE
Leukocytes, UA: NEGATIVE
Nitrite: NEGATIVE
PH: 6 (ref 5.0–8.0)
SPECIFIC GRAVITY, URINE: 1.02 (ref 1.000–1.030)
Total Protein, Urine: NEGATIVE
URINE GLUCOSE: NEGATIVE
UROBILINOGEN UA: 0.2 (ref 0.0–1.0)

## 2015-05-30 LAB — MICROALBUMIN / CREATININE URINE RATIO
CREATININE, U: 129.2 mg/dL
Microalb Creat Ratio: 0.5 mg/g (ref 0.0–30.0)

## 2015-05-30 MED ORDER — SULFAMETHOXAZOLE-TRIMETHOPRIM 800-160 MG PO TABS: 1.0000 | ORAL_TABLET | Freq: Two times a day (BID) | ORAL | Status: DC

## 2015-05-30 NOTE — Progress Notes (Signed)
Subjective:    Patient ID: Michael Shepard, male    DOB: 06/21/1956, 59 y.o.   MRN: [id number]  Chief Complaint  Patient presents with  . Hematuria    he states that he feels the same as he did when he had a prostate infection, feel stinging, pressure when using the bathroom    HPI:  Michael Shepard is a 59 y.o. male who  has a past medical history of Generalized anxiety disorder; Other and unspecified hyperlipidemia; Unspecified essential hypertension; Esophageal reflux; and Cerebral AVM. and presents today for an acute office visit.   1.) Hematuria - This is a new problem. Symptoms of pressure and slow urine flow have been going on for about 1 month. Most recently noted a small amount of red in his urine for 1 day and then has been clear. Denies any fevers or chills, penile pain or discharge. Denies any modifying factors that make his symptoms better. Had a previous episode that was similar to this about 2 years ago with similar symptoms that was prostatitis treated with cirpofloxacin. A follow up cystoscopy with urology was negative as he was not experiencing symptoms when he was evaluated.  Allergies  Allergen Reactions  . Erythromycin Nausea And Vomiting  . Penicillins Hives and Swelling    REACTION: Swelling Hives     Current Outpatient Prescriptions on File Prior to Visit  Medication Sig Dispense Refill  . losartan-hydrochlorothiazide (HYZAAR) 50-12.5 MG per tablet Take 1 tablet by mouth daily. 90 tablet 3  . omeprazole (PRILOSEC) 20 MG capsule TAKE 2 CAPSULES (40 MG TOTAL) BY MOUTH DAILY. 180 capsule 2  . omeprazole (PRILOSEC) 20 MG capsule TAKE 2 CAPSULES (40 MG TOTAL) BY MOUTH DAILY. 180 capsule 1  . simvastatin (ZOCOR) 40 MG tablet Take 1 tablet (40 mg total) by mouth at bedtime. 90 tablet 3   No current facility-administered medications on file prior to visit.     Past Surgical History  Procedure Laterality Date  . Hemorrhoid surgery  1998  . Wisdom tooth  extraction    . Colonoscopy  11-22-04    Review of Systems  Genitourinary: Positive for hematuria, decreased urine volume and difficulty urinating. Negative for urgency, frequency, flank pain, discharge, penile swelling, scrotal swelling, penile pain and testicular pain.      Objective:    BP 140/84 mmHg  Pulse 86  Temp(Src) 98.2 F (36.8 C) (Oral)  Resp 18  Ht 5\' 10"  (1.778 m)  Wt 192 lb (87.091 kg)  BMI 27.55 kg/m2  SpO2 95% Nursing note and vital signs reviewed.  Physical Exam  Constitutional: He is oriented to person, place, and time. He appears well-developed and well-nourished. No distress.  Cardiovascular: Normal rate, regular rhythm, normal heart sounds and intact distal pulses.   Pulmonary/Chest: Effort normal and breath sounds normal.  Abdominal: Soft. Normal appearance and bowel sounds are normal. There is no hepatosplenomegaly. There is no tenderness. There is no rigidity, no rebound, no guarding, no CVA tenderness, no tenderness at McBurney's point and negative Murphy's sign.  Genitourinary: Rectal exam shows no mass.  Neurological: He is alert and oriented to person, place, and time.  Skin: Skin is warm and dry.  Psychiatric: He has a normal mood and affect. His behavior is normal. Judgment and thought content normal.       Assessment & Plan:   Problem List Items Addressed This Visit      Other   Hematuria - Primary    One episode  of gross hematuria which has resolved to microscopic hematuria as noted on in office UA that was negative for leukocytes or nitrites, but positive for blood. Obtain microalbumin, PSA, CBC with diff, and UA. Start bactrim. Follow up pending lab work results.       Relevant Medications   sulfamethoxazole-trimethoprim (BACTRIM DS,SEPTRA DS) 800-160 MG per tablet   Other Relevant Orders   POCT urinalysis dipstick (Completed)   Microalbumin / creatinine urine ratio   Urinalysis, Routine w reflex microscopic   CBC w/Diff   PSA

## 2015-05-30 NOTE — Patient Instructions (Signed)
Thank you for choosing [facility].  Summary/Instructions:  Your prescription(s) have been submitted to your pharmacy or been printed and provided for you. Please take as directed and contact our office if you believe you are having problem(s) with the medication(s) or have any questions.  Please stop by the lab on the basement level of the building for your blood work. Your results will be released to [facility] (or called to you) after review, usually within 72 hours after test completion. If any changes need to be made, you will be notified at that same time.  Please stop by radiology on the basement level of the building for your x-rays. Your results will be released to [facility] (or called to you) after review, usually within 72 hours after test completion. If any treatments or changes are necessary, you will be notified at that same time.  Referrals have been made during this visit. You should expect to hear back from our schedulers in about 7-10 days in regards to establishing an appointment with the specialists we discussed.   If your symptoms worsen or fail to improve, please contact our office for further instruction, or in case of emergency go directly to the emergency room at the closest medical facility.    Hematuria Hematuria is blood in your urine. It can be caused by a bladder infection, kidney infection, prostate infection, kidney stone, or cancer of your urinary tract. Infections can usually be treated with medicine, and a kidney stone usually will pass through your urine. If neither of these is the cause of your hematuria, further workup to find out the reason may be needed. It is very important that you tell your health care provider about any blood you see in your urine, even if the blood stops without treatment or happens without causing pain. Blood in your urine that happens and then stops and then happens again can be a symptom of a very serious condition. Also, pain is not  a symptom in the initial stages of many urinary cancers. HOME CARE INSTRUCTIONS   Drink lots of fluid, 3-4 quarts a day. If you have been diagnosed with an infection, cranberry juice is especially recommended, in addition to large amounts of water.  Avoid caffeine, tea, and carbonated beverages because they tend to irritate the bladder.  Avoid alcohol because it may irritate the prostate.  Take all medicines as directed by your health care provider.  If you were prescribed an antibiotic medicine, finish it all even if you start to feel better.  If you have been diagnosed with a kidney stone, follow your health care provider's instructions regarding straining your urine to catch the stone.  Empty your bladder often. Avoid holding urine for long periods of time.  After a bowel movement, women should cleanse front to back. Use each tissue only once.  Empty your bladder before and after sexual intercourse if you are a male. SEEK MEDICAL CARE IF:  You develop back pain.  You have a fever.  You have a feeling of sickness in your stomach (nausea) or vomiting.  Your symptoms are not better in 3 days. Return sooner if you are getting worse. SEEK IMMEDIATE MEDICAL CARE IF:   You develop severe vomiting and are unable to keep the medicine down.  You develop severe back or abdominal pain despite taking your medicines.  You begin passing a large amount of blood or clots in your urine.  You feel extremely weak or faint, or you pass out. MAKE  SURE YOU:   Understand these instructions.  Will watch your condition.  Will get help right away if you are not doing well or get worse. Document Released: 08/20/2005 Document Revised: 01/04/2014 Document Reviewed: 04/20/2013 [facility] Patient Information 2015 [place], [place]. This information is not intended to replace advice given to you by your health care provider. Make sure you discuss any questions you have with your health care  provider.

## 2015-05-30 NOTE — Assessment & Plan Note (Signed)
One episode of gross hematuria which has resolved to microscopic hematuria as noted on in office UA that was negative for leukocytes or nitrites, but positive for blood. Obtain microalbumin, PSA, CBC with diff, and UA. Start bactrim. Follow up pending lab work results.

## 2015-05-30 NOTE — Progress Notes (Signed)
Pre visit review using our clinic review tool, if applicable. No additional management support is needed unless otherwise documented below in the visit note.   Getting flu shot at work

## 2015-05-31 ENCOUNTER — Encounter: Payer: Self-pay | Admitting: Family

## 2015-05-31 LAB — PSA: PSA: 3.25 ng/mL (ref 0.10–4.00)

## 2015-07-09 ENCOUNTER — Other Ambulatory Visit: Payer: Self-pay | Admitting: Internal Medicine

## 2015-07-21 ENCOUNTER — Encounter: Payer: 59 | Admitting: Internal Medicine

## 2015-09-17 ENCOUNTER — Other Ambulatory Visit: Payer: Self-pay | Admitting: Internal Medicine

## 2016-02-09 ENCOUNTER — Encounter: Payer: Self-pay | Admitting: Internal Medicine

## 2016-02-09 ENCOUNTER — Ambulatory Visit (INDEPENDENT_AMBULATORY_CARE_PROVIDER_SITE_OTHER): Payer: PRIVATE HEALTH INSURANCE | Admitting: Internal Medicine

## 2016-02-09 ENCOUNTER — Other Ambulatory Visit (INDEPENDENT_AMBULATORY_CARE_PROVIDER_SITE_OTHER): Payer: PRIVATE HEALTH INSURANCE

## 2016-02-09 VITALS — BP 162/90 | HR 92 | Temp 98.1°F | Resp 16 | Ht 70.0 in | Wt 195.0 lb

## 2016-02-09 DIAGNOSIS — Z9989 Dependence on other enabling machines and devices: Secondary | ICD-10-CM

## 2016-02-09 DIAGNOSIS — Z0000 Encounter for general adult medical examination without abnormal findings: Secondary | ICD-10-CM

## 2016-02-09 DIAGNOSIS — W57XXXA Bitten or stung by nonvenomous insect and other nonvenomous arthropods, initial encounter: Secondary | ICD-10-CM

## 2016-02-09 DIAGNOSIS — G4733 Obstructive sleep apnea (adult) (pediatric): Secondary | ICD-10-CM | POA: Insufficient documentation

## 2016-02-09 DIAGNOSIS — R7989 Other specified abnormal findings of blood chemistry: Secondary | ICD-10-CM

## 2016-02-09 DIAGNOSIS — R0683 Snoring: Secondary | ICD-10-CM | POA: Diagnosis not present

## 2016-02-09 DIAGNOSIS — E785 Hyperlipidemia, unspecified: Secondary | ICD-10-CM

## 2016-02-09 DIAGNOSIS — Z1159 Encounter for screening for other viral diseases: Secondary | ICD-10-CM

## 2016-02-09 DIAGNOSIS — T148 Other injury of unspecified body region: Secondary | ICD-10-CM

## 2016-02-09 DIAGNOSIS — I10 Essential (primary) hypertension: Secondary | ICD-10-CM

## 2016-02-09 LAB — COMPREHENSIVE METABOLIC PANEL
ALT: 24 U/L (ref 0–53)
AST: 17 U/L (ref 0–37)
Albumin: 4.8 g/dL (ref 3.5–5.2)
Alkaline Phosphatase: 64 U/L (ref 39–117)
BILIRUBIN TOTAL: 0.3 mg/dL (ref 0.2–1.2)
BUN: 13 mg/dL (ref 6–23)
CO2: 33 meq/L — AB (ref 19–32)
CREATININE: 0.9 mg/dL (ref 0.40–1.50)
Calcium: 9.4 mg/dL (ref 8.4–10.5)
Chloride: 101 mEq/L (ref 96–112)
GFR: 92.08 mL/min (ref >60.00)
GLUCOSE: 149 mg/dL — AB (ref 70–99)
Potassium: 4 mEq/L (ref 3.5–5.1)
SODIUM: 140 meq/L (ref 135–145)
Total Protein: 7.3 g/dL (ref 6.0–8.3)

## 2016-02-09 LAB — LIPID PANEL
Cholesterol: 223 mg/dL — ABNORMAL HIGH (ref 0–200)
HDL: 48.9 mg/dL (ref >39.00)
NONHDL: 173.76
Total CHOL/HDL Ratio: 5
Triglycerides: 302 mg/dL — ABNORMAL HIGH (ref 0.0–149.0)
VLDL: 60.4 mg/dL — ABNORMAL HIGH (ref 0.0–40.0)

## 2016-02-09 LAB — LDL CHOLESTEROL, DIRECT: LDL DIRECT: 144 mg/dL

## 2016-02-09 MED ORDER — VALSARTAN-HYDROCHLOROTHIAZIDE 80-12.5 MG PO TABS: 1.0000 | ORAL_TABLET | Freq: Every day | ORAL | Status: DC

## 2016-02-09 NOTE — Assessment & Plan Note (Signed)
Taking zocor without side effects and checking lipid panel and adjust as needed.

## 2016-02-09 NOTE — Assessment & Plan Note (Signed)
Checking lyme titer although the chances are not high. If positive titer treat with doxycyline. No EM lesion now or previously.

## 2016-02-09 NOTE — Assessment & Plan Note (Signed)
Switch to valsartan/hctz for better control as he was controlled before switch to losartan/hctz.

## 2016-02-09 NOTE — Assessment & Plan Note (Signed)
Checking home sleep study for OSA, suspect this may be the cause of his worsening control over his hypertension. Treat as appropriate.

## 2016-02-09 NOTE — Assessment & Plan Note (Signed)
Colonoscopy due in 2018, immunizations up to date. Checking hep c and HIV screening today. Skin exam done today and reminded about sun safety and mole surveillance. Given screening recommendations.

## 2016-02-09 NOTE — Progress Notes (Signed)
Pre visit review using our clinic review tool, if applicable. No additional management support is needed unless otherwise documented below in the visit note. 

## 2016-02-09 NOTE — Progress Notes (Signed)
   Subjective:    Patient ID: Michael Shepard, male    DOB: [birth year], 60 y.o.   MRN: [id number]  HPI The patient is a 60 YO man coming in for wellness. Has many tick bites recently and wants to know if he has lyme. Some arthritis and tiredness. His girlfriend also noticed that he snores and sometimes stops breathing in the night.   PMH, [facility], social history reviewed and updated.   Review of Systems  Constitutional: Positive for fatigue. Negative for fever, activity change, appetite change and unexpected weight change.  HENT: Negative.   Eyes: Negative.   Respiratory: Negative for cough, chest tightness, shortness of breath and wheezing.   Cardiovascular: Negative for chest pain, palpitations and leg swelling.  Gastrointestinal: Negative for abdominal pain, diarrhea, constipation and abdominal distention.  Musculoskeletal: Positive for arthralgias. Negative for myalgias, back pain, gait problem, neck pain and neck stiffness.  Skin: Negative.   Neurological: Negative for dizziness, weakness, light-headedness and headaches.  Psychiatric/Behavioral: Negative.       Objective:   Physical Exam  Constitutional: He is oriented to person, place, and time. He appears well-developed and well-nourished. No distress.  HENT:  Head: Normocephalic and atraumatic.  Eyes: EOM are normal.  Neck: Normal range of motion.  Cardiovascular: Normal rate and regular rhythm.   No murmur heard. Pulmonary/Chest: Effort normal and breath sounds normal. No respiratory distress. He has no wheezes. He has no rales.  Abdominal: Soft. Bowel sounds are normal. He exhibits no distension. There is no tenderness. There is no rebound.  Musculoskeletal: He exhibits no edema or tenderness.  Neurological: He is alert and oriented to person, place, and time. Coordination normal.  Skin: Skin is warm and dry. No rash noted. No erythema.  Psychiatric: He has a normal mood and affect.   Filed Vitals:   02/09/16 1430  BP:  162/90  Pulse: 92  Temp: 98.1 F (36.7 C)  TempSrc: Oral  Resp: 16  Height: 5\' 10"  (1.778 m)  Weight: 195 lb (88.451 kg)  SpO2: 98%      Assessment & Plan:

## 2016-02-09 NOTE — Patient Instructions (Signed)
We are checking the blood work today for lyme disease, hepatitis C and HIV. We are also checking your routine labs.   Keep up the good work with the exercise.   We have sent in the valsartan to switch back to in order to see if the blood pressure comes back down.   Health Maintenance, Male A healthy lifestyle and preventative care can promote health and wellness.  Maintain regular health, dental, and eye exams.  Eat a healthy diet. Foods like vegetables, fruits, whole grains, low-fat dairy products, and lean protein foods contain the nutrients you need and are low in calories. Decrease your intake of foods high in solid fats, added sugars, and salt. Get information about a proper diet from your health care provider, if necessary.  Regular physical exercise is one of the most important things you can do for your health. Most adults should get at least 150 minutes of moderate-intensity exercise (any activity that increases your heart rate and causes you to sweat) each week. In addition, most adults need muscle-strengthening exercises on 2 or more days a week.   Maintain a healthy weight. The body mass index (BMI) is a screening tool to identify possible weight problems. It provides an estimate of body fat based on height and weight. Your health care provider can find your BMI and can help you achieve or maintain a healthy weight. For males 20 years and older:  A BMI below 18.5 is considered underweight.  A BMI of 18.5 to 24.9 is normal.  A BMI of 25 to 29.9 is considered overweight.  A BMI of 30 and above is considered obese.  Maintain normal blood lipids and cholesterol by exercising and minimizing your intake of saturated fat. Eat a balanced diet with plenty of fruits and vegetables. Blood tests for lipids and cholesterol should begin at age 65 and be repeated every 5 years. If your lipid or cholesterol levels are high, you are over age 18, or you are at high risk for heart disease, you may  need your cholesterol levels checked more frequently.Ongoing high lipid and cholesterol levels should be treated with medicines if diet and exercise are not working.  If you smoke, find out from your health care provider how to quit. If you do not use tobacco, do not start.  Lung cancer screening is recommended for adults aged 34-80 years who are at high risk for developing lung cancer because of a history of smoking. A yearly low-dose CT scan of the lungs is recommended for people who have at least a 30-pack-year history of smoking and are current smokers or have quit within the past 15 years. A pack year of smoking is smoking an average of 1 pack of cigarettes a day for 1 year (for example, a 30-pack-year history of smoking could mean smoking 1 pack a day for 30 years or 2 packs a day for 15 years). Yearly screening should continue until the smoker has stopped smoking for at least 15 years. Yearly screening should be stopped for people who develop a health problem that would prevent them from having lung cancer treatment.  If you choose to drink alcohol, do not have more than 2 drinks per day. One drink is considered to be 12 oz (360 mL) of beer, 5 oz (150 mL) of wine, or 1.5 oz (45 mL) of liquor.  Avoid the use of street drugs. Do not share needles with anyone. Ask for help if you need support or instructions about stopping  the use of drugs.  High blood pressure causes heart disease and increases the risk of stroke. High blood pressure is more likely to develop in:  People who have blood pressure in the end of the normal range (100-139/85-89 mm Hg).  People who are overweight or obese.  People who are African American.  If you are 55-55 years of age, have your blood pressure checked every 3-5 years. If you are 48 years of age or older, have your blood pressure checked every year. You should have your blood pressure measured twice--once when you are at a hospital or clinic, and once when you are  not at a hospital or clinic. Record the average of the two measurements. To check your blood pressure when you are not at a hospital or clinic, you can use:  An automated blood pressure machine at a pharmacy.  A home blood pressure monitor.  If you are 76-61 years old, ask your health care provider if you should take aspirin to prevent heart disease.  Diabetes screening involves taking a blood sample to check your fasting blood sugar level. This should be done once every 3 years after age 55 if you are at a normal weight and without risk factors for diabetes. Testing should be considered at a younger age or be carried out more frequently if you are overweight and have at least 1 risk factor for diabetes.  Colorectal cancer can be detected and often prevented. Most routine colorectal cancer screening begins at the age of 46 and continues through age 14. However, your health care provider may recommend screening at an earlier age if you have risk factors for colon cancer. On a yearly basis, your health care provider may provide home test kits to check for hidden blood in the stool. A small camera at the end of a tube may be used to directly examine the colon (sigmoidoscopy or colonoscopy) to detect the earliest forms of colorectal cancer. Talk to your health care provider about this at age 58 when routine screening begins. A direct exam of the colon should be repeated every 5-10 years through age 76, unless early forms of precancerous polyps or small growths are found.  People who are at an increased risk for hepatitis B should be screened for this virus. You are considered at high risk for hepatitis B if:  You were born in a country where hepatitis B occurs often. Talk with your health care provider about which countries are considered high risk.  Your parents were born in a high-risk country and you have not received a shot to protect against hepatitis B (hepatitis B vaccine).  You have HIV or  AIDS.  You use needles to inject street drugs.  You live with, or have sex with, someone who has hepatitis B.  You are a man who has sex with other men (MSM).  You get hemodialysis treatment.  You take certain medicines for conditions like cancer, organ transplantation, and autoimmune conditions.  Hepatitis C blood testing is recommended for all people born from [birth year] through 1963 and any individual with known risk factors for hepatitis C.  Healthy men should no longer receive prostate-specific antigen (PSA) blood tests as part of routine cancer screening. Talk to your health care provider about prostate cancer screening.  Testicular cancer screening is not recommended for adolescents or adult males who have no symptoms. Screening includes self-exam, a health care provider exam, and other screening tests. Consult with your health care provider about any  symptoms you have or any concerns you have about testicular cancer.  Practice safe sex. Use condoms and avoid high-risk sexual practices to reduce the spread of sexually transmitted infections (STIs).  You should be screened for STIs, including gonorrhea and chlamydia if:  You are sexually active and are younger than 24 years.  You are older than 24 years, and your health care provider tells you that you are at risk for this type of infection.  Your sexual activity has changed since you were last screened, and you are at an increased risk for chlamydia or gonorrhea. Ask your health care provider if you are at risk.  If you are at risk of being infected with HIV, it is recommended that you take a prescription medicine daily to prevent HIV infection. This is called pre-exposure prophylaxis (PrEP). You are considered at risk if:  You are a man who has sex with other men (MSM).  You are a heterosexual man who is sexually active with multiple partners.  You take drugs by injection.  You are sexually active with a partner who has  HIV.  Talk with your health care provider about whether you are at high risk of being infected with HIV. If you choose to begin PrEP, you should first be tested for HIV. You should then be tested every 3 months for as long as you are taking PrEP.  Use sunscreen. Apply sunscreen liberally and repeatedly throughout the day. You should seek shade when your shadow is shorter than you. Protect yourself by wearing long sleeves, pants, a wide-brimmed hat, and sunglasses year round whenever you are outdoors.  Tell your health care provider of new moles or changes in moles, especially if there is a change in shape or color. Also, tell your health care provider if a mole is larger than the size of a pencil eraser.  A one-time screening for abdominal aortic aneurysm (AAA) and surgical repair of large AAAs by ultrasound is recommended for men aged 82-75 years who are current or former smokers.  Stay current with your vaccines (immunizations).   This information is not intended to replace advice given to you by your health care provider. Make sure you discuss any questions you have with your health care provider.   Document Released: 02/16/2008 Document Revised: 09/10/2014 Document Reviewed: 01/15/2011 Elsevier Interactive Patient Education [employer].

## 2016-02-10 LAB — HEPATITIS C ANTIBODY: HCV AB: NEGATIVE

## 2016-02-10 LAB — HIV ANTIBODY (ROUTINE TESTING W REFLEX): HIV: NONREACTIVE

## 2016-02-10 LAB — LYME AB/WESTERN BLOT REFLEX

## 2016-02-28 ENCOUNTER — Other Ambulatory Visit: Payer: Self-pay | Admitting: *Deleted

## 2016-02-28 MED ORDER — OMEPRAZOLE 20 MG PO CPDR: DELAYED_RELEASE_CAPSULE | ORAL | Status: DC

## 2016-02-28 MED ORDER — SIMVASTATIN 40 MG PO TABS: ORAL_TABLET | ORAL | Status: DC

## 2016-09-27 ENCOUNTER — Other Ambulatory Visit: Payer: Self-pay | Admitting: Internal Medicine

## 2016-10-01 ENCOUNTER — Encounter: Payer: Self-pay | Admitting: Internal Medicine

## 2016-10-01 ENCOUNTER — Ambulatory Visit (INDEPENDENT_AMBULATORY_CARE_PROVIDER_SITE_OTHER): Payer: PRIVATE HEALTH INSURANCE | Admitting: Internal Medicine

## 2016-10-01 DIAGNOSIS — G4733 Obstructive sleep apnea (adult) (pediatric): Secondary | ICD-10-CM

## 2016-10-01 DIAGNOSIS — Z9989 Dependence on other enabling machines and devices: Secondary | ICD-10-CM

## 2016-10-01 NOTE — Progress Notes (Signed)
Pre visit review using our clinic review tool, if applicable. No additional management support is needed unless otherwise documented below in the visit note. 

## 2016-10-01 NOTE — Progress Notes (Signed)
   Subjective:    Patient ID: Michael Shepard, male    DOB: [birth year], 61 y.o.   MRN: [id number]  HPI The patient is a 61 YO man coming in for follow up on starting CPAP. He is using the CPAP most nights (>90%) and >4 hours per night. He is feeling more awake during the day and no morning headaches. He is also stopped snoring and his girlfriend noticed he does not stop breathing anymore. No complaints or problems with it.   Review of Systems  Constitutional: Positive for activity change. Negative for appetite change, fatigue, fever and unexpected weight change.  Respiratory: Negative.   Cardiovascular: Negative.   Gastrointestinal: Negative.   Musculoskeletal: Negative.   Psychiatric/Behavioral: Negative.       Objective:   Physical Exam  Constitutional: He is oriented to person, place, and time. He appears well-developed and well-nourished.  HENT:  Head: Normocephalic and atraumatic.  Eyes: EOM are normal.  Neck: Normal range of motion.  Cardiovascular: Normal rate and regular rhythm.   Pulmonary/Chest: Effort normal and breath sounds normal.  Abdominal: Soft.  Neurological: He is alert and oriented to person, place, and time.  Skin: Skin is warm and dry.  Mole of the left thigh which is about 1 mm circular, shiny surface   Vitals:   10/01/16 0859  BP: 130/68  Pulse: 88  Resp: 12  Temp: 97.8 F (36.6 C)  TempSrc: Oral  SpO2: 98%  Weight: 201 lb (91.2 kg)  Height: 5\' 10"  (1.778 m)      Assessment & Plan:

## 2016-10-01 NOTE — Assessment & Plan Note (Signed)
Using CPAP and compliant with usage. Data shows it is effective for his OSA reviewed during visit. This is improving his QOL as well with more energy during the day and clearer thought.

## 2016-11-20 ENCOUNTER — Other Ambulatory Visit: Payer: Self-pay | Admitting: Internal Medicine

## 2016-12-06 ENCOUNTER — Encounter: Payer: Self-pay | Admitting: Internal Medicine

## 2016-12-06 ENCOUNTER — Ambulatory Visit (INDEPENDENT_AMBULATORY_CARE_PROVIDER_SITE_OTHER): Payer: PRIVATE HEALTH INSURANCE | Admitting: Internal Medicine

## 2016-12-06 ENCOUNTER — Other Ambulatory Visit (INDEPENDENT_AMBULATORY_CARE_PROVIDER_SITE_OTHER): Payer: PRIVATE HEALTH INSURANCE

## 2016-12-06 VITALS — BP 152/90 | HR 83 | Temp 98.2°F | Resp 12 | Ht 70.0 in | Wt 198.0 lb

## 2016-12-06 DIAGNOSIS — R079 Chest pain, unspecified: Secondary | ICD-10-CM

## 2016-12-06 LAB — COMPREHENSIVE METABOLIC PANEL
ALT: 26 U/L (ref 0–53)
AST: 17 U/L (ref 0–37)
Albumin: 4.5 g/dL (ref 3.5–5.2)
Alkaline Phosphatase: 60 U/L (ref 39–117)
BILIRUBIN TOTAL: 0.4 mg/dL (ref 0.2–1.2)
BUN: 11 mg/dL (ref 6–23)
CHLORIDE: 103 meq/L (ref 96–112)
CO2: 30 meq/L (ref 19–32)
Calcium: 9.4 mg/dL (ref 8.4–10.5)
Creatinine, Ser: 0.97 mg/dL (ref 0.40–1.50)
GFR: 84.22 mL/min (ref >60.00)
GLUCOSE: 101 mg/dL — AB (ref 70–99)
POTASSIUM: 3.9 meq/L (ref 3.5–5.1)
Sodium: 140 mEq/L (ref 135–145)
Total Protein: 7.2 g/dL (ref 6.0–8.3)

## 2016-12-06 LAB — CBC
HEMATOCRIT: 42.6 % (ref 39.0–52.0)
HEMOGLOBIN: 14.6 g/dL (ref 13.0–17.0)
MCHC: 34.2 g/dL (ref 30.0–36.0)
MCV: 89.7 fl (ref 78.0–100.0)
Platelets: 276 10*3/uL (ref 150.0–400.0)
RBC: 4.74 Mil/uL (ref 4.22–5.81)
RDW: 12.8 % (ref 11.5–15.5)
WBC: 7.1 10*3/uL (ref 4.0–10.5)

## 2016-12-06 LAB — LIPID PANEL
CHOL/HDL RATIO: 4
Cholesterol: 195 mg/dL (ref 0–200)
HDL: 50.1 mg/dL (ref >39.00)
LDL Cholesterol: 113 mg/dL — ABNORMAL HIGH (ref 0–99)
NONHDL: 145.37
Triglycerides: 164 mg/dL — ABNORMAL HIGH (ref 0.0–149.0)
VLDL: 32.8 mg/dL (ref 0.0–40.0)

## 2016-12-06 LAB — HEMOGLOBIN A1C: Hgb A1c MFr Bld: 6 % (ref 4.6–6.5)

## 2016-12-06 LAB — TROPONIN I: TNIDX: 0 ug/l (ref 0.00–0.06)

## 2016-12-06 NOTE — Patient Instructions (Signed)
The EKG of the heart is normal and not changed from before.   We are checking the labs today and will send the results on mychart.

## 2016-12-06 NOTE — Progress Notes (Signed)
   Subjective:    Patient ID: Michael Shepard, male    DOB: Nov 02, 1955, 61 y.o.   MRN: [id number]  HPI The patient is a 61 YO man coming in for chest pains over the weekend. He was doing some yard work and "tweaked" his mid back. The pain started in the mid back and was also in the front of his chest mostly in the middle. This moved slightly to the left side of his chest which worried him. This was constant 4/10 pain for 2 days then started easing up. He has had small episodes of the pain which last less than 1 minute for the last 2 days but overall is about 80% recovered. He had chest pains about 2-3 years ago and sought care and had stress test (negative). He did not have associated diaphoresis, fatigue, nausea, vomiting. No recent travel and no chest pain with breathing. His back pain is also resolved.   Review of Systems  Constitutional: Positive for activity change. Negative for appetite change, chills, diaphoresis, fatigue, fever and unexpected weight change.  Respiratory: Negative.   Cardiovascular: Positive for chest pain. Negative for palpitations and leg swelling.  Gastrointestinal: Negative.   Musculoskeletal: Positive for arthralgias, back pain and myalgias. Negative for gait problem, joint swelling, neck pain and neck stiffness.  Skin: Negative.   Neurological: Negative.       Objective:   Physical Exam  Constitutional: He is oriented to person, place, and time. He appears well-developed and well-nourished. No distress.  HENT:  Head: Normocephalic and atraumatic.  Eyes: EOM are normal.  Neck: Normal range of motion.  Cardiovascular: Normal rate and regular rhythm.   No murmur heard. Pulmonary/Chest: Effort normal and breath sounds normal. No respiratory distress. He has no wheezes. He has no rales. He exhibits no tenderness.  Abdominal: Soft.  Musculoskeletal: He exhibits no edema.  Neurological: He is alert and oriented to person, place, and time.  Skin: Skin is warm and  dry. He is not diaphoretic.   Vitals:   12/06/16 1558  BP: (!) 152/90  Pulse: 83  Resp: 12  Temp: 98.2 F (36.8 C)  TempSrc: Oral  SpO2: 99%  Weight: 198 lb (89.8 kg)  Height: 5\' 10"  (1.778 m)   EKG: Rate 77, axis normal, intervals normal, sinus, no st or t wave changes, no significant change from 2015.    Assessment & Plan:

## 2016-12-06 NOTE — Progress Notes (Signed)
Pre visit review using our clinic review tool, if applicable. No additional management support is needed unless otherwise documented below in the visit note. 

## 2016-12-07 NOTE — Assessment & Plan Note (Signed)
Sounds to be more musculoskeletal as associated with outdoor work. EKG done in the office which does not show any changes. Stress testing done within 3 years which was negative. Checking HgA1c, CMP, troponin, lipid panel for risk stratification. BP mildly high today and previously at goal. Will monitor again.

## 2016-12-19 ENCOUNTER — Encounter: Payer: Self-pay | Admitting: *Deleted

## 2017-01-25 ENCOUNTER — Encounter: Payer: Self-pay | Admitting: Internal Medicine

## 2017-04-07 ENCOUNTER — Other Ambulatory Visit: Payer: Self-pay | Admitting: Internal Medicine

## 2017-06-27 ENCOUNTER — Other Ambulatory Visit: Payer: Self-pay | Admitting: Internal Medicine

## 2017-07-03 ENCOUNTER — Other Ambulatory Visit: Payer: Self-pay | Admitting: Internal Medicine

## 2017-09-16 ENCOUNTER — Telehealth: Payer: Self-pay | Admitting: Internal Medicine

## 2017-09-17 ENCOUNTER — Other Ambulatory Visit: Payer: Self-pay

## 2017-09-17 MED ORDER — OMEPRAZOLE 20 MG PO CPDR: 40.0000 mg | DELAYED_RELEASE_CAPSULE | Freq: Every day | ORAL | 0 refills | Status: DC

## 2017-09-17 MED ORDER — SIMVASTATIN 40 MG PO TABS: 40.0000 mg | ORAL_TABLET | Freq: Every day | ORAL | 0 refills | Status: DC

## 2017-09-17 NOTE — Telephone Encounter (Signed)
Re sent to [facility]

## 2017-09-17 NOTE — Telephone Encounter (Signed)
Please send to correct pharmacy, [facility]

## 2017-09-18 ENCOUNTER — Other Ambulatory Visit: Payer: Self-pay

## 2017-09-18 MED ORDER — VALSARTAN-HYDROCHLOROTHIAZIDE 80-12.5 MG PO TABS: 1.0000 | ORAL_TABLET | Freq: Every day | ORAL | 0 refills | Status: DC

## 2017-09-26 ENCOUNTER — Encounter: Payer: Self-pay | Admitting: Internal Medicine

## 2017-10-22 ENCOUNTER — Ambulatory Visit (INDEPENDENT_AMBULATORY_CARE_PROVIDER_SITE_OTHER)
Admission: RE | Admit: 2017-10-22 | Discharge: 2017-10-22 | Disposition: A | Discharge status: Home / Self Care | Payer: 59 | Source: Ambulatory Visit | Attending: Internal Medicine | Admitting: Internal Medicine

## 2017-10-22 ENCOUNTER — Ambulatory Visit: Payer: 59 | Admitting: Internal Medicine

## 2017-10-22 ENCOUNTER — Encounter: Payer: Self-pay | Admitting: Internal Medicine

## 2017-10-22 VITALS — BP 112/70 | HR 72 | Temp 97.9°F | Ht 70.0 in | Wt 199.0 lb

## 2017-10-22 DIAGNOSIS — M25511 Pain in right shoulder: Secondary | ICD-10-CM | POA: Diagnosis not present

## 2017-10-22 DIAGNOSIS — G8929 Other chronic pain: Secondary | ICD-10-CM | POA: Diagnosis not present

## 2017-10-22 MED ORDER — PREDNISONE 20 MG PO TABS
40.0000 mg | ORAL_TABLET | Freq: Every day | ORAL | 0 refills | Status: DC
Start: 2017-10-22 — End: 2017-11-05

## 2017-10-22 NOTE — Progress Notes (Signed)
   Subjective:    Patient ID: Michael Shepard, male    DOB: 02-13-1956, 62 y.o.   MRN: [id number]  HPI The patient is a 62 YO man coming in for pain in the right shoulder going on for some months now. This is gradually worsening overall. Denies numbness or weakness. Not dropping things. Denies injury or overuse and no old injury in that shoulder. He is limiting some activities due to pain. Has not taken anything as it is only with some activities. Denies back or neck pain. Denies elbow pain.   Review of Systems  Constitutional: Positive for activity change. Negative for appetite change, fatigue, fever and unexpected weight change.  Respiratory: Negative.   Cardiovascular: Negative.   Musculoskeletal: Positive for arthralgias and myalgias. Negative for back pain, gait problem and joint swelling.  Skin: Negative.   Neurological: Negative for syncope, weakness and numbness.      Objective:   Physical Exam  Constitutional: He is oriented to person, place, and time. He appears well-developed and well-nourished.  HENT:  Head: Normocephalic and atraumatic.  Eyes: EOM are normal.  Neck: Normal range of motion.  Cardiovascular: Normal rate and regular rhythm.  Pulmonary/Chest: Effort normal and breath sounds normal. No respiratory distress. He has no wheezes. He has no rales.  Abdominal: Soft.  Musculoskeletal: He exhibits tenderness. He exhibits no edema.  Some pain in the [facility] joint and radiates to the head of the biceps  Neurological: He is alert and oriented to person, place, and time. Coordination normal.  Skin: Skin is warm and dry.   Vitals:   10/22/17 0828  BP: 112/70  Pulse: 72  Temp: 97.9 F (36.6 C)  TempSrc: Oral  SpO2: 98%  Weight: 199 lb (90.3 kg)  Height: 5\' 10"  (1.778 m)      Assessment & Plan:

## 2017-10-22 NOTE — Patient Instructions (Addendum)
We will get you the prednisone to take 2 pills daily for 5 days to calm down the shoulder.   You can try turmeric twice a day to help with the inflammation.    Shoulder Range of Motion Exercises Shoulder range of motion (ROM) exercises are designed to keep the shoulder moving freely. They are often recommended for people who have shoulder pain. Phase 1 exercises When you are able, do this exercise 5-6 days per week, or as told by your health care provider. Work toward doing 2 sets of 10 swings. Pendulum Exercise How To Do This Exercise Lying Down 1. Lie face-down on a bed with your abdomen close to the side of the bed. 2. Let your arm hang over the side of the bed. 3. Relax your shoulder, arm, and hand. 4. Slowly and gently swing your arm forward and back. Do not use your neck muscles to swing your arm. They should be relaxed. If you are struggling to swing your arm, have someone gently swing it for you. When you do this exercise for the first time, swing your arm at a 15 degree angle for 15 seconds, or swing your arm 10 times. As pain lessens over time, increase the angle of the swing to 30-45 degrees. 5. Repeat steps 1-4 with the other arm.  How To Do This Exercise While Standing 1. Stand next to a sturdy chair or table and hold on to it with your hand. 1. Bend forward at the waist. 2. Bend your knees slightly. 3. Relax your other arm and let it hang limp. 4. Relax the shoulder blade of the arm that is hanging and let it drop. 5. While keeping your shoulder relaxed, use body motion to swing your arm in small circles. The first time you do this exercise, swing your arm for about 30 seconds or 10 times. When you do it next time, swing your arm for a little longer. 6. Stand up tall and relax. 7. Repeat steps 1-7, this time changing the direction of the circles. 2. Repeat steps 1-8 with the other arm.  Phase 2 exercises Do these exercises 3-4 times per day on 5-6 days per week or as told  by your health care provider. Work toward holding the stretch for 20 seconds. Stretching Exercise 1 1. Lift your arm straight out in front of you. 2. Bend your arm 90 degrees at the elbow (right angle) so your forearm goes across your body and looks like the letter "L." 3. Use your other arm to gently pull the elbow forward and across your body. 4. Repeat steps 1-3 with the other arm. Stretching Exercise 2 You will need a towel or rope for this exercise. 1. Bend one arm behind your back with the palm facing outward. 2. Hold a towel with your other hand. 3. Reach the arm that holds the towel above your head, and bend that arm at the elbow. Your wrist should be behind your neck. 4. Use your free hand to grab the free end of the towel. 5. With the higher hand, gently pull the towel up behind you. 6. With the lower hand, pull the towel down behind you. 7. Repeat steps 1-6 with the other arm.  Phase 3 exercises Do each of these exercises at four different times of day (sessions) every day or as told by your health care provider. To begin with, repeat each exercise 5 times (repetitions). Work toward doing 3 sets of 12 repetitions or as told by  your health care provider. Strengthening Exercise 1 You will need a light weight for this activity. As you grow stronger, you may use a heavier weight. 1. Standing with a weight in your hand, lift your arm straight out to the side until it is at the same height as your shoulder. 2. Bend your arm at 90 degrees so that your fingers are pointing to the ceiling. 3. Slowly raise your hand until your arm is straight up in the air. 4. Repeat steps 1-3 with the other arm.  Strengthening Exercise 2 You will need a light weight for this activity. As you grow stronger, you may use a heavier weight. 1. Standing with a weight in your hand, gradually move your straight arm in an arc, starting at your side, then out in front of you, then straight up over your  head. 2. Gradually move your other arm in an arc, starting at your side, then out in front of you, then straight up over your head. 3. Repeat steps 1-2 with the other arm.  Strengthening Exercise 3 You will need an elastic band for this activity. As you grow stronger, gradually increase the size of the bands or increase the number of bands that you use at one time. 1. While standing, hold an elastic band in one hand and raise that arm up in the air. 2. With your other hand, pull down the band until that hand is by your side. 3. Repeat steps 1-2 with the other arm.  This information is not intended to replace advice given to you by your health care provider. Make sure you discuss any questions you have with your health care provider. Document Released: 05/19/2003 Document Revised: 04/15/2016 Document Reviewed: 08/16/2014 Elsevier Interactive Patient Education  [employer].

## 2017-10-22 NOTE — Assessment & Plan Note (Signed)
Rx for prednisone flare. Checking x-ray shoulder given the duration of the pain to rule out arthritis. If no improvement PT versus sports medicine.

## 2017-11-04 ENCOUNTER — Telehealth: Payer: Self-pay | Admitting: Internal Medicine

## 2017-11-04 NOTE — Telephone Encounter (Signed)
Patient returned call and says [facility] will not fill the prednisone because they do not fill short term medications. He said it will need to be sent to [facility].  [id number] [place] [place] [facility] [address] [phone] (Phone) [phone] (Fax)

## 2017-11-04 NOTE — Telephone Encounter (Signed)
Patient called, left VM to call the office. Will need to know if he received the prednisone prescription sent to [facility] on 10/22/17.

## 2017-11-04 NOTE — Telephone Encounter (Signed)
Copied from [place]. Topic: Quick Communication - See Telephone Encounter >> Nov 04, 2017  8:32 AM Bea Graff, NT wrote: CRM for notification. See Telephone encounter for: Pt needs his  [facility]SONE [facility] [address] [facility]  11/04/17.

## 2017-11-05 ENCOUNTER — Other Ambulatory Visit: Payer: Self-pay

## 2017-11-05 MED ORDER — PREDNISONE 20 MG PO TABS: 40.0000 mg | ORAL_TABLET | Freq: Every day | ORAL | 0 refills | Status: DC

## 2017-11-05 NOTE — Telephone Encounter (Signed)
[facility]

## 2017-11-19 ENCOUNTER — Ambulatory Visit (AMBULATORY_SURGERY_CENTER): Payer: Self-pay

## 2017-11-19 ENCOUNTER — Other Ambulatory Visit: Payer: Self-pay

## 2017-11-19 VITALS — Ht 70.0 in | Wt 202.4 lb

## 2017-11-19 DIAGNOSIS — Z1211 Encounter for screening for malignant neoplasm of colon: Secondary | ICD-10-CM

## 2017-11-19 MED ORDER — NA SULFATE-K SULFATE-MG SULF 17.5-3.13-1.6 GM/177ML PO SOLN: 1.0000 | Freq: Once | ORAL | 0 refills | Status: AC

## 2017-11-19 NOTE — Progress Notes (Signed)
Denies allergies to eggs or soy products. Denies complication of anesthesia or sedation. Denies use of weight loss medication. Denies use of O2.   Emmi instructions declined.  

## 2017-11-20 ENCOUNTER — Encounter: Payer: Self-pay | Admitting: Internal Medicine

## 2017-11-28 ENCOUNTER — Encounter: Payer: Self-pay | Admitting: Internal Medicine

## 2017-11-28 ENCOUNTER — Other Ambulatory Visit: Payer: Self-pay

## 2017-11-28 ENCOUNTER — Ambulatory Visit (AMBULATORY_SURGERY_CENTER): Payer: 59 | Admitting: Internal Medicine

## 2017-11-28 VITALS — BP 117/85 | HR 66 | Temp 98.2°F | Resp 10 | Ht 70.0 in | Wt 202.0 lb

## 2017-11-28 DIAGNOSIS — Z1211 Encounter for screening for malignant neoplasm of colon: Secondary | ICD-10-CM | POA: Diagnosis not present

## 2017-11-28 DIAGNOSIS — D125 Benign neoplasm of sigmoid colon: Secondary | ICD-10-CM

## 2017-11-28 DIAGNOSIS — D128 Benign neoplasm of rectum: Secondary | ICD-10-CM | POA: Diagnosis not present

## 2017-11-28 DIAGNOSIS — D127 Benign neoplasm of rectosigmoid junction: Secondary | ICD-10-CM | POA: Diagnosis not present

## 2017-11-28 MED ORDER — SODIUM CHLORIDE 0.9 % IV SOLN: 500.0000 mL | Freq: Once | INTRAVENOUS | Status: DC

## 2017-11-28 NOTE — Progress Notes (Signed)
Called to room to assist during endoscopic procedure.  Patient ID and intended procedure confirmed with present staff. Received instructions for my participation in the procedure from the performing physician.  

## 2017-11-28 NOTE — Patient Instructions (Signed)
Information on polyps diverticulosis hemorrhoids.   YOU HAD AN ENDOSCOPIC PROCEDURE TODAY AT [facility]:   Refer to the procedure report that was given to you for any specific questions about what was found during the examination.  If the procedure report does not answer your questions, please call your gastroenterologist to clarify.  If you requested that your care partner not be given the details of your procedure findings, then the procedure report has been included in a sealed envelope for you to review at your convenience later.  YOU SHOULD EXPECT: Some feelings of bloating in the abdomen. Passage of more gas than usual.  Walking can help get rid of the air that was put into your GI tract during the procedure and reduce the bloating. If you had a lower endoscopy (such as a colonoscopy or flexible sigmoidoscopy) you may notice spotting of blood in your stool or on the toilet paper. If you underwent a bowel prep for your procedure, you may not have a normal bowel movement for a few days.  Please Note:  You might notice some irritation and congestion in your nose or some drainage.  This is from the oxygen used during your procedure.  There is no need for concern and it should clear up in a day or so.  SYMPTOMS TO REPORT IMMEDIATELY:   Following lower endoscopy (colonoscopy or flexible sigmoidoscopy):  Excessive amounts of blood in the stool  Significant tenderness or worsening of abdominal pains  Swelling of the abdomen that is new, acute  Fever of 100F or higher For urgent or emergent issues, a gastroenterologist can be reached at any hour by calling [phone].   DIET:  We do recommend a small meal at first, but then you may proceed to your regular diet.  Drink plenty of fluids but you should avoid alcoholic beverages for 24 hours.  ACTIVITY:  You should plan to take it easy for the rest of today and you should NOT DRIVE or use heavy machinery until tomorrow (because  of the sedation medicines used during the test).    FOLLOW UP: Our staff will call the number listed on your records the next business day following your procedure to check on you and address any questions or concerns that you may have regarding the information given to you following your procedure. If we do not reach you, we will leave a message.  However, if you are feeling well and you are not experiencing any problems, there is no need to return our call.  We will assume that you have returned to your regular daily activities without incident.  If any biopsies were taken you will be contacted by phone or by letter within the next 1-3 weeks.  Please call us at [phone] if you have not heard about the biopsies in 3 weeks.    SIGNATURES/CONFIDENTIALITY: You and/or your care partner have signed paperwork which will be entered into your electronic medical record.  These signatures attest to the fact that that the information above on your After Visit Summary has been reviewed and is understood.  Full responsibility of the confidentiality of this discharge information lies with you and/or your care-partner.

## 2017-11-28 NOTE — Progress Notes (Signed)
Report to RN, VSS, adequate respirations noted, no c/o pain or discomfort 

## 2017-11-28 NOTE — Op Note (Signed)
[place] Patient Name: Michael Shepard Procedure Date: 11/28/2017 10:04 AM MRN: [id number] Endoscopist: Jerene Bears , MD Age: 62 Referring MD:  Date of Birth: February 01, 1956 Gender: Male Account #: [id number] Procedure:                Colonoscopy Indications:              Screening for colorectal malignant neoplasm Medicines:                Monitored Anesthesia Care Procedure:                Pre-Anesthesia Assessment:                           - Prior to the procedure, a History and Physical                            was performed, and patient medications and                            allergies were reviewed. The patient's tolerance of                            previous anesthesia was also reviewed. The risks                            and benefits of the procedure and the sedation                            options and risks were discussed with the patient.                            All questions were answered, and informed consent                            was obtained. Prior Anticoagulants: The patient has                            taken no previous anticoagulant or antiplatelet                            agents. ASA Grade Assessment: II - A patient with                            mild systemic disease. After reviewing the risks                            and benefits, the patient was deemed in                            satisfactory condition to undergo the procedure.                           After obtaining informed consent, the colonoscope  was passed under direct vision. Throughout the                            procedure, the patient's blood pressure, pulse, and                            oxygen saturations were monitored continuously. The                            Colonoscope was introduced through the anus and                            advanced to the the cecum, identified by                            appendiceal orifice and  ileocecal valve. The                            colonoscopy was performed without difficulty. The                            patient tolerated the procedure well. The quality                            of the bowel preparation was good. The ileocecal                            valve, appendiceal orifice, and rectum were                            photographed. The bowel preparation used was 2 day                            Suprep. Scope In: 10:13:25 AM Scope Out: 10:25:13 AM Scope Withdrawal Time: 0 hours 9 minutes 30 seconds  Total Procedure Duration: 0 hours 11 minutes 48 seconds  Findings:                 The digital rectal exam was normal.                           Two sessile polyps were found in the rectum and                            sigmoid colon. The polyps were 5 to 6 mm in size.                            These polyps were removed with a cold snare.                            Resection and retrieval were complete.                           Multiple small and large-mouthed diverticula were  found in the sigmoid colon and descending colon.                           Internal hemorrhoids were found during                            retroflexion. The hemorrhoids were small. Complications:            No immediate complications. Estimated Blood Loss:     Estimated blood loss was minimal. Impression:               - Two 5 to 6 mm polyps in the rectum and in the                            sigmoid colon, removed with a cold snare. Resected                            and retrieved.                           - Moderate diverticulosis in the sigmoid colon and                            in the descending colon.                           - Internal hemorrhoids. Recommendation:           - Patient has a contact number available for                            emergencies. The signs and symptoms of potential                            delayed complications were  discussed with the                            patient. Return to normal activities tomorrow.                            Written discharge instructions were provided to the                            patient.                           - Resume previous diet.                           - Continue present medications.                           - Await pathology results.                           - Repeat colonoscopy is recommended. The  colonoscopy date will be determined after pathology                            results from today's exam become available for                            review. Jerene Bears, MD 11/28/2017 10:28:38 AM This report has been signed electronically.

## 2017-11-28 NOTE — Progress Notes (Signed)
Pt. Reports no change in his medical or surgical history since his pre-visit 11/19/2017.

## 2017-11-29 ENCOUNTER — Telehealth: Payer: Self-pay | Admitting: *Deleted

## 2017-11-29 NOTE — Telephone Encounter (Signed)
  Follow up Call-  Call back number 11/28/2017  Post procedure Call Back phone  # [phone]  Permission to leave phone message Yes  Some recent data might be hidden     Patient questions:  Do you have a fever, pain , or abdominal swelling? No. Pain Score  0 *  Have you tolerated food without any problems? Yes.    Have you been able to return to your normal activities? Yes.    Do you have any questions about your discharge instructions: Diet   No. Medications  No. Follow up visit  No.  Do you have questions or concerns about your Care? No.  Actions: * If pain score is 4 or above: No action needed, pain <4.

## 2017-12-03 ENCOUNTER — Encounter: Payer: Self-pay | Admitting: Internal Medicine

## 2018-01-01 ENCOUNTER — Other Ambulatory Visit: Payer: Self-pay | Admitting: Internal Medicine

## 2018-01-20 ENCOUNTER — Telehealth: Payer: Self-pay | Admitting: Internal Medicine

## 2018-01-20 DIAGNOSIS — D229 Melanocytic nevi, unspecified: Secondary | ICD-10-CM

## 2018-01-20 NOTE — Telephone Encounter (Signed)
Patient is wanting a referral to dermatology

## 2018-01-20 NOTE — Telephone Encounter (Signed)
Copied from [place] [phone]. Topic: Referral - Request >> Jan 20, 2018  8:54 AM Margot Ables wrote: Reason for CRM: Pt called stating he had discuss basal cell carcinoma possibility on his left leg with Dr. Sharlet Salina and is not wanting referral to Dermatology to look at this (preferring late afternoon) . Pt prefers to stay in [place] area, in network with [employer]. Please advise.

## 2018-01-20 NOTE — Telephone Encounter (Signed)
Referral placed.

## 2018-01-20 NOTE — Telephone Encounter (Signed)
LVM informing patient referral was placed and he will be getting a call to schedule an appointment with them

## 2018-02-05 ENCOUNTER — Telehealth: Payer: Self-pay | Admitting: Internal Medicine

## 2018-02-05 NOTE — Telephone Encounter (Addendum)
Left message for pt to call office back to schedule annual visit so that medications can be filled. Last acute visit on 10/22/17 for shoulder pain.

## 2018-02-05 NOTE — Telephone Encounter (Signed)
Copied from [place] [id number]. Topic: Quick Communication - Rx Refill/Question >> Feb 05, 2018  8:38 AM Robina Ade, Helene Kelp D wrote: Medication:omeprazole (PRILOSEC) 20 MG capsule,simvastatin (ZOCOR) 40 MG tablet,valsartan-hydrochlorothiazide (DIOVAN-HCT) 80-12.5 MG tablet  Has the patient contacted their pharmacy? Yes (Agent: If no, request that the patient contact the pharmacy for the refill.) (Agent: If yes, when and what did the pharmacy advise?)  Preferred Pharmacy (with phone number or street name): [facility] [place] [place] to Registered [facility]  Agent: Please be advised that RX refills may take up to 3 business days. We ask that you follow-up with your pharmacy.

## 2018-02-20 ENCOUNTER — Encounter: Payer: Self-pay | Admitting: Internal Medicine

## 2018-02-20 ENCOUNTER — Other Ambulatory Visit (INDEPENDENT_AMBULATORY_CARE_PROVIDER_SITE_OTHER): Payer: 59

## 2018-02-20 ENCOUNTER — Ambulatory Visit (INDEPENDENT_AMBULATORY_CARE_PROVIDER_SITE_OTHER): Payer: 59 | Admitting: Internal Medicine

## 2018-02-20 VITALS — BP 124/80 | HR 73 | Temp 98.1°F | Ht 70.0 in | Wt 197.0 lb

## 2018-02-20 DIAGNOSIS — G4733 Obstructive sleep apnea (adult) (pediatric): Secondary | ICD-10-CM

## 2018-02-20 DIAGNOSIS — Z0000 Encounter for general adult medical examination without abnormal findings: Secondary | ICD-10-CM

## 2018-02-20 DIAGNOSIS — I10 Essential (primary) hypertension: Secondary | ICD-10-CM

## 2018-02-20 DIAGNOSIS — E782 Mixed hyperlipidemia: Secondary | ICD-10-CM

## 2018-02-20 DIAGNOSIS — Z9989 Dependence on other enabling machines and devices: Secondary | ICD-10-CM

## 2018-02-20 DIAGNOSIS — K219 Gastro-esophageal reflux disease without esophagitis: Secondary | ICD-10-CM | POA: Diagnosis not present

## 2018-02-20 DIAGNOSIS — Z23 Encounter for immunization: Secondary | ICD-10-CM

## 2018-02-20 LAB — CBC
HCT: 42.9 % (ref 39.0–52.0)
HEMOGLOBIN: 14.9 g/dL (ref 13.0–17.0)
MCHC: 34.6 g/dL (ref 30.0–36.0)
MCV: 89.6 fl (ref 78.0–100.0)
Platelets: 263 10*3/uL (ref 150.0–400.0)
RBC: 4.79 Mil/uL (ref 4.22–5.81)
RDW: 13 % (ref 11.5–15.5)
WBC: 6.3 10*3/uL (ref 4.0–10.5)

## 2018-02-20 LAB — LIPID PANEL
Cholesterol: 227 mg/dL — ABNORMAL HIGH (ref 0–200)
HDL: 56.9 mg/dL (ref >39.00)
LDL CALC: 145 mg/dL — AB (ref 0–99)
NONHDL: 170.06
TRIGLYCERIDES: 127 mg/dL (ref 0.0–149.0)
Total CHOL/HDL Ratio: 4
VLDL: 25.4 mg/dL (ref 0.0–40.0)

## 2018-02-20 LAB — COMPREHENSIVE METABOLIC PANEL
ALT: 21 U/L (ref 0–53)
AST: 13 U/L (ref 0–37)
Albumin: 4.7 g/dL (ref 3.5–5.2)
Alkaline Phosphatase: 60 U/L (ref 39–117)
BILIRUBIN TOTAL: 0.5 mg/dL (ref 0.2–1.2)
BUN: 15 mg/dL (ref 6–23)
CALCIUM: 9.5 mg/dL (ref 8.4–10.5)
CHLORIDE: 99 meq/L (ref 96–112)
CO2: 29 mEq/L (ref 19–32)
Creatinine, Ser: 0.94 mg/dL (ref 0.40–1.50)
GFR: 86.97 mL/min (ref >60.00)
GLUCOSE: 98 mg/dL (ref 70–99)
POTASSIUM: 3.9 meq/L (ref 3.5–5.1)
Sodium: 138 mEq/L (ref 135–145)
Total Protein: 7.4 g/dL (ref 6.0–8.3)

## 2018-02-20 LAB — HEMOGLOBIN A1C: Hgb A1c MFr Bld: 6 % (ref 4.6–6.5)

## 2018-02-20 MED ORDER — SIMVASTATIN 40 MG PO TABS: 40.0000 mg | ORAL_TABLET | Freq: Every day | ORAL | 3 refills | Status: DC

## 2018-02-20 MED ORDER — OMEPRAZOLE 20 MG PO CPDR: 40.0000 mg | DELAYED_RELEASE_CAPSULE | Freq: Every day | ORAL | 3 refills | Status: DC

## 2018-02-20 MED ORDER — VALSARTAN-HYDROCHLOROTHIAZIDE 80-12.5 MG PO TABS: 1.0000 | ORAL_TABLET | Freq: Every day | ORAL | 3 refills | Status: DC

## 2018-02-20 NOTE — Patient Instructions (Signed)
We have given you the tetanus shot today and added you to the waiting list for shingles vaccine.    Health Maintenance, Male A healthy lifestyle and preventive care is important for your health and wellness. Ask your health care provider about what schedule of regular examinations is right for you. What should I know about weight and diet? Eat a Healthy Diet  Eat plenty of vegetables, fruits, whole grains, low-fat dairy products, and lean protein.  Do not eat a lot of foods high in solid fats, added sugars, or salt.  Maintain a Healthy Weight Regular exercise can help you achieve or maintain a healthy weight. You should:  Do at least 150 minutes of exercise each week. The exercise should increase your heart rate and make you sweat (moderate-intensity exercise).  Do strength-training exercises at least twice a week.  Watch Your Levels of Cholesterol and Blood Lipids  Have your blood tested for lipids and cholesterol every 5 years starting at 60 years of age. If you are at high risk for heart disease, you should start having your blood tested when you are 60 years old. You may need to have your cholesterol levels checked more often if: ? Your lipid or cholesterol levels are high. ? You are older than 60 years of age. ? You are at high risk for heart disease.  What should I know about cancer screening? Many types of cancers can be detected early and may often be prevented. Lung Cancer  You should be screened every year for lung cancer if: ? You are a current smoker who has smoked for at least 30 years. ? You are a former smoker who has quit within the past 15 years.  Talk to your health care provider about your screening options, when you should start screening, and how often you should be screened.  Colorectal Cancer  Routine colorectal cancer screening usually begins at 60 years of age and should be repeated every 5-10 years until you are 60 years old. You may need to be screened  more often if early forms of precancerous polyps or small growths are found. Your health care provider may recommend screening at an earlier age if you have risk factors for colon cancer.  Your health care provider may recommend using home test kits to check for hidden blood in the stool.  A small camera at the end of a tube can be used to examine your colon (sigmoidoscopy or colonoscopy). This checks for the earliest forms of colorectal cancer.  Prostate and Testicular Cancer  Depending on your age and overall health, your health care provider may do certain tests to screen for prostate and testicular cancer.  Talk to your health care provider about any symptoms or concerns you have about testicular or prostate cancer.  Skin Cancer  Check your skin from head to toe regularly.  Tell your health care provider about any new moles or changes in moles, especially if: ? There is a change in a mole's size, shape, or color. ? You have a mole that is larger than a pencil eraser.  Always use sunscreen. Apply sunscreen liberally and repeat throughout the day.  Protect yourself by wearing long sleeves, pants, a wide-brimmed hat, and sunglasses when outside.  What should I know about heart disease, diabetes, and high blood pressure?  If you are 46-60 years of age, have your blood pressure checked every 3-5 years. If you are 78 years of age or older, have your blood pressure checked  every year. You should have your blood pressure measured twice-once when you are at a hospital or clinic, and once when you are not at a hospital or clinic. Record the average of the two measurements. To check your blood pressure when you are not at a hospital or clinic, you can use: ? An automated blood pressure machine at a pharmacy. ? A home blood pressure monitor.  Talk to your health care provider about your target blood pressure.  If you are between 24-38 years old, ask your health care provider if you should  take aspirin to prevent heart disease.  Have regular diabetes screenings by checking your fasting blood sugar level. ? If you are at a normal weight and have a low risk for diabetes, have this test once every three years after the age of 57. ? If you are overweight and have a high risk for diabetes, consider being tested at a younger age or more often.  A one-time screening for abdominal aortic aneurysm (AAA) by ultrasound is recommended for men aged 3-75 years who are current or former smokers. What should I know about preventing infection? Hepatitis B If you have a higher risk for hepatitis B, you should be screened for this virus. Talk with your health care provider to find out if you are at risk for hepatitis B infection. Hepatitis C Blood testing is recommended for:  Everyone born from [birth year] through 1963.  Anyone with known risk factors for hepatitis C.  Sexually Transmitted Diseases (STDs)  You should be screened each year for STDs including gonorrhea and chlamydia if: ? You are sexually active and are younger than 60 years of age. ? You are older than 60 years of age and your health care provider tells you that you are at risk for this type of infection. ? Your sexual activity has changed since you were last screened and you are at an increased risk for chlamydia or gonorrhea. Ask your health care provider if you are at risk.  Talk with your health care provider about whether you are at high risk of being infected with HIV. Your health care provider may recommend a prescription medicine to help prevent HIV infection.  What else can I do?  Schedule regular health, dental, and eye exams.  Stay current with your vaccines (immunizations).  Do not use any tobacco products, such as cigarettes, chewing tobacco, and e-cigarettes. If you need help quitting, ask your health care provider.  Limit alcohol intake to no more than 2 drinks per day. One drink equals 12 ounces of beer, 5  ounces of wine, or 1 ounces of hard liquor.  Do not use street drugs.  Do not share needles.  Ask your health care provider for help if you need support or information about quitting drugs.  Tell your health care provider if you often feel depressed.  Tell your health care provider if you have ever been abused or do not feel safe at home. This information is not intended to replace advice given to you by your health care provider. Make sure you discuss any questions you have with your health care provider. Document Released: 02/16/2008 Document Revised: 04/18/2016 Document Reviewed: 05/24/2015 Elsevier Interactive Patient Education  [employer].

## 2018-02-20 NOTE — Progress Notes (Signed)
   Subjective:    Patient ID: Michael Shepard, male    DOB: 1955/09/20, 62 y.o.   MRN: [id number]  HPI The patient is a 62 YO man coming in for physical. No new concerns. Hiking 3 times per week average.   PMH, [facility], social history reviewed and updated.   Review of Systems  Constitutional: Negative.   HENT: Negative.   Eyes: Negative.   Respiratory: Negative for cough, chest tightness and shortness of breath.   Cardiovascular: Negative for chest pain, palpitations and leg swelling.  Gastrointestinal: Negative for abdominal distention, abdominal pain, constipation, diarrhea, nausea and vomiting.  Musculoskeletal: Negative.   Skin: Negative.   Neurological: Negative.   Psychiatric/Behavioral: Negative.       Objective:   Physical Exam  Constitutional: He is oriented to person, place, and time. He appears well-developed and well-nourished.  HENT:  Head: Normocephalic and atraumatic.  Eyes: EOM are normal.  Neck: Normal range of motion.  Cardiovascular: Normal rate and regular rhythm.  Pulmonary/Chest: Effort normal and breath sounds normal. No respiratory distress. He has no wheezes. He has no rales.  Abdominal: Soft. Bowel sounds are normal. He exhibits no distension. There is no tenderness. There is no rebound.  Musculoskeletal: He exhibits no edema.  Neurological: He is alert and oriented to person, place, and time. Coordination normal.  Skin: Skin is warm and dry.  Psychiatric: He has a normal mood and affect.   Vitals:   02/20/18 1520  BP: 124/80  Pulse: 73  Temp: 98.1 F (36.7 C)  TempSrc: Oral  SpO2: 97%  Weight: 197 lb (89.4 kg)  Height: 5\' 10"  (1.778 m)      Assessment & Plan:  Tdap given at visit

## 2018-02-21 NOTE — Assessment & Plan Note (Signed)
Flu shot yearly. Pneumonia not indicated. Shingrix added to waiting list. Tetanus given at visit. Colonoscopy up to date. Counseled about sun safety and mole surveillance. Counseled about the dangers of distracted driving. Given 10 year screening recommendations.

## 2018-02-21 NOTE — Assessment & Plan Note (Signed)
Taking omeprazole and sometimes takes 20 mg and sometimes 40 mg with good control of symptoms. When he tries to wean off he gets recurrent symptoms.

## 2018-02-21 NOTE — Assessment & Plan Note (Signed)
Still using CPAP and no problems.

## 2018-02-21 NOTE — Assessment & Plan Note (Signed)
Checking lipid panel and adjust simvastatin 40 mg daily if needed.

## 2018-02-21 NOTE — Assessment & Plan Note (Signed)
BP at goal, refilled valsartan/hctz. Checking CMP and adjust if needed.

## 2018-04-16 ENCOUNTER — Ambulatory Visit: Payer: Self-pay

## 2018-04-16 NOTE — Telephone Encounter (Signed)
Pt. called to report he has had  "pinkish-red urine" since Sunday.  Reported he passed large clots x 2 on Sunday, and since then, he has passed intermittent small clots with urination.  Stated he has the pinkish red color to urine with each voiding.  C/o  "mild pressure discomfort in bladder region, before and after urination, and burning with urination."  Stated he has had frequency of urination since last week, with slight decrease in frequency recently.  Reported has noted some difficulty in emptying his bladder adequately.  Stated he didn't call sooner, because has had blood in urine in past that resolved on it's own.  Denied fever/ chills, abdominal, flank, or back pain, or nausea/ vomiting.  Denied noting any blood in stool. Denied any feeling of fatigue, or any other symptoms.  Due to presence of blood and clots in urine x 4 days, recommended to go to ER.  The pt. stated he rode with someone else to work today, and for that reason, will wait until tomorrow morning, to go to the ER.  Offered an appt. with Dr. Sharlet Shepard in the AM, with the understanding that if the bleeding worsens, or has increased presence of clots, he should go to the ER tonight.  Verb. Understanding and agrees with plan.            Reason for Disposition . Blood in urine  (Exception: could be normal menstrual bleeding) . Passing pure blood or large blood clots (i.e., size > a dime) (Exception: fleck or small strands)  Answer Assessment - Initial Assessment Questions 1. COLOR of URINE: "Describe the color of the urine."  (e.g., tea-colored, pink, red, blood clots, bloody)     Pinkish - red with dark red clots  2. ONSET: "When did the bleeding start?"      Sunday, 8/11 3. EPISODES: "How many times has there been blood in the urine?" or "How many times today?"     Almost every time he urinates since onset on Sunday  4. PAIN with URINATION: "Is there any pain with passing your urine?" If so, ask: "How bad is the pain?"  (Scale 1-10;  or mild, moderate, severe)    - MILD - complains slightly about urination hurting    - MODERATE - interferes with normal activities      - SEVERE - excruciating, unwilling or unable to urinate because of the pain     Mild discomfort before and after urination with slight burning during urination 5. FEVER: "Do you have a fever?" If so, ask: "What is your temperature, how was it measured, and when did it start?"     denied 6. ASSOCIATED SYMPTOMS: "Are you passing urine more frequently than usual?"     Also feels like he can't empty his bladder adequately. Frequency of urination 7. ANY OTHER SYMPTOMS: "Do you have any other symptoms?" (e.g., back/flank pain, abdominal pain, vomiting)     Denied back/ flank pain, abdominal pain, nausea or vomiting  Protocols used: URINE - BLOOD IN-A-AH

## 2018-04-17 ENCOUNTER — Encounter: Payer: Self-pay | Admitting: Internal Medicine

## 2018-04-17 ENCOUNTER — Ambulatory Visit: Payer: 59 | Admitting: Internal Medicine

## 2018-04-17 VITALS — BP 120/80 | HR 87 | Temp 98.0°F | Ht 70.0 in | Wt 199.0 lb

## 2018-04-17 DIAGNOSIS — R319 Hematuria, unspecified: Secondary | ICD-10-CM

## 2018-04-17 LAB — POCT URINALYSIS DIPSTICK
BILIRUBIN UA: NEGATIVE
GLUCOSE UA: NEGATIVE
KETONES UA: NEGATIVE
Leukocytes, UA: NEGATIVE
NITRITE UA: NEGATIVE
PROTEIN UA: NEGATIVE
SPEC GRAV UA: 1.01 (ref 1.010–1.025)
Urobilinogen, UA: 0.2 E.U./dL
pH, UA: 6 (ref 5.0–8.0)

## 2018-04-17 MED ORDER — CIPROFLOXACIN HCL 500 MG PO TABS: 500.0000 mg | ORAL_TABLET | Freq: Two times a day (BID) | ORAL | 0 refills | Status: DC

## 2018-04-17 NOTE — Assessment & Plan Note (Addendum)
Suspect prostatitis. Rx for cipro 2 week course. Prior CT without kidney stones and no pain to suggest this now. If not resolving needs to see urology for further evaluation. U/A done in the office with hematuria only.

## 2018-04-17 NOTE — Telephone Encounter (Signed)
Noted  

## 2018-04-17 NOTE — Progress Notes (Signed)
   Subjective:    Patient ID: Michael Shepard, male    DOB: July 05, 1956, 62 y.o.   MRN: [id number]  HPI The patient is a 62 YO man coming in for blood in urine. Started about 5 days or so ago. Some little clots in the urine. No blood in underpants. Overall it is improving and today mildly pink but no clots. Denies pain with urination or burning. Some pressure with urination and some pressure in pelvis for 1 day. Denies penile or scrotal pain or rash or discharge. No back pain. Denies history of kidney stones. Similar episode in the past with CT scan no kidney stones was treated for prostatitis and recovered. He has been having some hesitancy with urination the week prior to symptoms and some now.   Review of Systems  Constitutional: Negative.   HENT: Negative.   Eyes: Negative.   Respiratory: Negative for cough, chest tightness and shortness of breath.   Cardiovascular: Negative for chest pain, palpitations and leg swelling.  Gastrointestinal: Negative for abdominal distention, abdominal pain, constipation, diarrhea, nausea and vomiting.  Genitourinary: Positive for difficulty urinating, frequency and hematuria. Negative for decreased urine volume, discharge, dysuria, flank pain, genital sores, penile pain, penile swelling, scrotal swelling, testicular pain and urgency.  Musculoskeletal: Negative.   Skin: Negative.   Neurological: Negative.   Psychiatric/Behavioral: Negative.       Objective:   Physical Exam  Constitutional: He is oriented to person, place, and time. He appears well-developed and well-nourished.  HENT:  Head: Normocephalic and atraumatic.  Eyes: EOM are normal.  Neck: Normal range of motion.  Cardiovascular: Normal rate and regular rhythm.  Pulmonary/Chest: Effort normal and breath sounds normal. No respiratory distress. He has no wheezes. He has no rales.  Abdominal: Soft. Bowel sounds are normal. He exhibits no distension. There is no tenderness. There is no rebound.    Musculoskeletal: He exhibits no edema.  Neurological: He is alert and oriented to person, place, and time. Coordination normal.  Skin: Skin is warm and dry.  Psychiatric: He has a normal mood and affect.   Vitals:   04/17/18 1033  BP: 120/80  Pulse: 87  Temp: 98 F (36.7 C)  TempSrc: Oral  SpO2: 97%  Weight: 199 lb (90.3 kg)  Height: 5\' 10"  (1.778 m)      Assessment & Plan:

## 2018-04-17 NOTE — Patient Instructions (Signed)
We have sent in the 2 weeks of ciprofloxacin to take for likely prostate infection or inflammation.   Let us know if this is gone then. If not we will give you another 2 weeks.

## 2018-06-06 ENCOUNTER — Encounter: Payer: Self-pay | Admitting: Internal Medicine

## 2018-06-06 ENCOUNTER — Ambulatory Visit (INDEPENDENT_AMBULATORY_CARE_PROVIDER_SITE_OTHER): Payer: 59 | Admitting: Internal Medicine

## 2018-06-06 DIAGNOSIS — G4733 Obstructive sleep apnea (adult) (pediatric): Secondary | ICD-10-CM

## 2018-06-06 DIAGNOSIS — Z9989 Dependence on other enabling machines and devices: Secondary | ICD-10-CM

## 2018-06-06 NOTE — Progress Notes (Signed)
   Subjective:    Patient ID: Michael Shepard, male    DOB: [birth year], 62 y.o.   MRN: [id number]  HPI The patient is a 62 YO man coming in for follow up of osa. He is using cpap still. Still getting benefit. Feels less drowsy. Denies problems with it. Denies . Is using at least 75% of the time and >6 hours per night when using. Denies concerns. Wants to continue.   Review of Systems  Constitutional: Negative.   HENT: Negative.   Eyes: Negative.   Respiratory: Negative for cough, chest tightness and shortness of breath.   Cardiovascular: Negative for chest pain, palpitations and leg swelling.  Gastrointestinal: Negative for abdominal distention, abdominal pain, constipation, diarrhea, nausea and vomiting.  Musculoskeletal: Negative.   Skin: Negative.   Neurological: Negative.   Psychiatric/Behavioral: Negative.       Objective:   Physical Exam  Constitutional: He is oriented to person, place, and time. He appears well-developed and well-nourished.  HENT:  Head: Normocephalic and atraumatic.  Eyes: EOM are normal.  Neck: Normal range of motion.  Cardiovascular: Normal rate and regular rhythm.  Pulmonary/Chest: Effort normal and breath sounds normal. No respiratory distress. He has no wheezes. He has no rales.  Abdominal: Soft. Bowel sounds are normal. He exhibits no distension. There is no tenderness. There is no rebound.  Musculoskeletal: He exhibits no edema.  Neurological: He is alert and oriented to person, place, and time. Coordination normal.  Skin: Skin is warm and dry.  Psychiatric: He has a normal mood and affect.   Vitals:   06/06/18 1259  BP: 122/70  Pulse: 87  Temp: 98.6 F (37 C)  TempSrc: Oral  SpO2: 96%  Weight: 197 lb (89.4 kg)  Height: 5\' 10"  (1.778 m)      Assessment & Plan:

## 2018-06-06 NOTE — Assessment & Plan Note (Signed)
He is using >4 hours per night and >50 % of the time. He is actually using about 70-80% of the time. Agree with continuing as he is getting benefit.

## 2018-07-30 ENCOUNTER — Other Ambulatory Visit: Payer: Self-pay | Admitting: Internal Medicine

## 2018-07-30 NOTE — Telephone Encounter (Unsigned)
Copied from [place] [id number]. Topic: Quick Communication - Rx Refill/Question >> Jul 30, 2018  4:19 PM Mcneil, Ja-Kwan wrote: Medication: omeprazole (PRILOSEC) 20 MG capsule  Has the patient contacted their pharmacy? yes   Preferred Pharmacy (with phone number or street name): [facility] [place], [place] [facility] [facility] [phone] (Phone)  [phone] (Fax)  Agent: Please be advised that RX refills may take up to 3 business days. We ask that you follow-up with your pharmacy.

## 2018-07-30 NOTE — Telephone Encounter (Signed)
LR  02/20/18 for 180 caps and 3 refills Requested Prescriptions  Refused Prescriptions Disp Refills  . omeprazole (PRILOSEC) 20 MG capsule 180 capsule 3    Sig: Take 2 capsules (40 mg total) by mouth daily.     Gastroenterology: Proton Pump Inhibitors Passed - 07/30/2018  4:23 PM      Passed - Valid encounter within last 12 months    Recent Outpatient Visits          1 month ago OSA on CPAP   [place], Elizabeth A, MD   3 months ago Hematuria, unspecified type   [place], [place]   5 months ago Routine health maintenance   [place], MD   9 months ago Chronic right shoulder pain   [facility] -Chuck Hint, MD   1 year ago Chest pain, unspecified type   [facility] -Chuck Hint, MD

## 2018-10-02 ENCOUNTER — Telehealth: Payer: Self-pay | Admitting: Internal Medicine

## 2018-10-02 NOTE — Telephone Encounter (Signed)
Copied from [place] [id number]. Topic: Quick Communication - Rx Refill/Question >> Oct 02, 2018  4:44 PM Reyne Dumas L wrote: Medication:  omeprazole (PRILOSEC) 20 MG capsule  Pt states that this medication is now needing authorization prior to being able to get this through pharmacy using his insurance. Pt states the number to call is [phone]  Pt can be reached at [phone]

## 2018-10-03 NOTE — Telephone Encounter (Signed)
Called the number and approved for 12 months

## 2018-10-06 NOTE — Telephone Encounter (Signed)
10/03/2018-10/04/2019 

## 2019-02-05 ENCOUNTER — Other Ambulatory Visit: Payer: Self-pay | Admitting: Internal Medicine

## 2019-02-26 ENCOUNTER — Other Ambulatory Visit: Payer: Self-pay | Admitting: Internal Medicine

## 2019-03-31 ENCOUNTER — Other Ambulatory Visit: Payer: Self-pay | Admitting: Internal Medicine

## 2019-04-17 ENCOUNTER — Ambulatory Visit (INDEPENDENT_AMBULATORY_CARE_PROVIDER_SITE_OTHER): Payer: 59 | Admitting: Internal Medicine

## 2019-04-17 ENCOUNTER — Encounter: Payer: Self-pay | Admitting: Internal Medicine

## 2019-04-17 ENCOUNTER — Other Ambulatory Visit (INDEPENDENT_AMBULATORY_CARE_PROVIDER_SITE_OTHER): Payer: 59

## 2019-04-17 ENCOUNTER — Other Ambulatory Visit: Payer: Self-pay

## 2019-04-17 VITALS — BP 136/78 | HR 81 | Temp 98.5°F | Ht 70.0 in | Wt 196.0 lb

## 2019-04-17 DIAGNOSIS — I10 Essential (primary) hypertension: Secondary | ICD-10-CM

## 2019-04-17 DIAGNOSIS — Z0000 Encounter for general adult medical examination without abnormal findings: Secondary | ICD-10-CM

## 2019-04-17 DIAGNOSIS — Z23 Encounter for immunization: Secondary | ICD-10-CM

## 2019-04-17 DIAGNOSIS — K219 Gastro-esophageal reflux disease without esophagitis: Secondary | ICD-10-CM

## 2019-04-17 DIAGNOSIS — Z9989 Dependence on other enabling machines and devices: Secondary | ICD-10-CM

## 2019-04-17 DIAGNOSIS — E782 Mixed hyperlipidemia: Secondary | ICD-10-CM

## 2019-04-17 DIAGNOSIS — G4733 Obstructive sleep apnea (adult) (pediatric): Secondary | ICD-10-CM

## 2019-04-17 LAB — COMPREHENSIVE METABOLIC PANEL
ALT: 24 U/L (ref 0–53)
AST: 15 U/L (ref 0–37)
Albumin: 4.5 g/dL (ref 3.5–5.2)
Alkaline Phosphatase: 55 U/L (ref 39–117)
BUN: 15 mg/dL (ref 6–23)
CO2: 30 mEq/L (ref 19–32)
Calcium: 9.5 mg/dL (ref 8.4–10.5)
Chloride: 102 mEq/L (ref 96–112)
Creatinine, Ser: 0.98 mg/dL (ref 0.40–1.50)
GFR: 77.68 mL/min (ref >60.00)
Glucose, Bld: 115 mg/dL — ABNORMAL HIGH (ref 70–99)
Potassium: 3.9 mEq/L (ref 3.5–5.1)
Sodium: 139 mEq/L (ref 135–145)
Total Bilirubin: 0.4 mg/dL (ref 0.2–1.2)
Total Protein: 7.1 g/dL (ref 6.0–8.3)

## 2019-04-17 LAB — LDL CHOLESTEROL, DIRECT: Direct LDL: 135 mg/dL

## 2019-04-17 LAB — LIPID PANEL
Cholesterol: 221 mg/dL — ABNORMAL HIGH (ref 0–200)
HDL: 51.1 mg/dL (ref >39.00)
NonHDL: 169.99
Total CHOL/HDL Ratio: 4
Triglycerides: 339 mg/dL — ABNORMAL HIGH (ref 0.0–149.0)
VLDL: 67.8 mg/dL — ABNORMAL HIGH (ref 0.0–40.0)

## 2019-04-17 LAB — CBC
HCT: 41.2 % (ref 39.0–52.0)
Hemoglobin: 14.3 g/dL (ref 13.0–17.0)
MCHC: 34.7 g/dL (ref 30.0–36.0)
MCV: 91 fl (ref 78.0–100.0)
Platelets: 256 10*3/uL (ref 150.0–400.0)
RBC: 4.53 Mil/uL (ref 4.22–5.81)
RDW: 12.7 % (ref 11.5–15.5)
WBC: 6.5 10*3/uL (ref 4.0–10.5)

## 2019-04-17 LAB — HEMOGLOBIN A1C: Hgb A1c MFr Bld: 6 % (ref 4.6–6.5)

## 2019-04-17 MED ORDER — SIMVASTATIN 40 MG PO TABS: 40.0000 mg | ORAL_TABLET | Freq: Every day | ORAL | 3 refills | Status: DC

## 2019-04-17 MED ORDER — OMEPRAZOLE 20 MG PO CPDR: 20.0000 mg | DELAYED_RELEASE_CAPSULE | Freq: Two times a day (BID) | ORAL | 3 refills | Status: DC

## 2019-04-17 MED ORDER — VALSARTAN-HYDROCHLOROTHIAZIDE 80-12.5 MG PO TABS: 1.0000 | ORAL_TABLET | Freq: Every day | ORAL | 3 refills | Status: DC

## 2019-04-17 NOTE — Assessment & Plan Note (Signed)
BP at goal on valsartan/hctz 80/12.5 mg daily and checking CMP and adjust as needed.

## 2019-04-17 NOTE — Patient Instructions (Signed)

## 2019-04-17 NOTE — Assessment & Plan Note (Signed)
Flu shot reminded yearly. Shingrix given 1st. Tetanus up to date. Colonoscopy up to date. Counseled about sun safety and mole surveillance. Counseled about the dangers of distracted driving. Given 10 year screening recommendations.

## 2019-04-17 NOTE — Assessment & Plan Note (Signed)
Controlled with prilosec. Refill as needed. Symptoms with missing doses.

## 2019-04-17 NOTE — Assessment & Plan Note (Signed)
Still using CPAP and still helping.

## 2019-04-17 NOTE — Progress Notes (Signed)
   Subjective:   Patient ID: Michael Shepard, male    DOB: 16-Oct-1955, 63 y.o.   MRN: [id number]  HPI The patient is a 63 YO man coming in for physical.   PMH, [place], social history reviewed and updated  Review of Systems  Constitutional: Negative.   HENT: Negative.   Eyes: Negative.   Respiratory: Negative for cough, chest tightness and shortness of breath.   Cardiovascular: Negative for chest pain, palpitations and leg swelling.  Gastrointestinal: Negative for abdominal distention, abdominal pain, constipation, diarrhea, nausea and vomiting.  Musculoskeletal: Negative.   Skin: Negative.   Neurological: Negative.   Psychiatric/Behavioral: Negative.     Objective:  Physical Exam Constitutional:      Appearance: He is well-developed.  HENT:     Head: Normocephalic and atraumatic.  Neck:     Musculoskeletal: Normal range of motion.  Cardiovascular:     Rate and Rhythm: Normal rate and regular rhythm.  Pulmonary:     Effort: Pulmonary effort is normal. No respiratory distress.     Breath sounds: Normal breath sounds. No wheezing or rales.  Abdominal:     General: Bowel sounds are normal. There is no distension.     Palpations: Abdomen is soft.     Tenderness: There is no abdominal tenderness. There is no rebound.  Skin:    General: Skin is warm and dry.  Neurological:     Mental Status: He is alert and oriented to person, place, and time.     Coordination: Coordination normal.     Vitals:   04/17/19 1315  BP: 136/78  Pulse: 81  Temp: 98.5 F (36.9 C)  TempSrc: Oral  SpO2: 97%  Weight: 196 lb (88.9 kg)  Height: 5\' 10"  (1.778 m)    Assessment & Plan:  Shingrix IM given at visit

## 2019-04-17 NOTE — Assessment & Plan Note (Signed)
Taking simvastatin 40 mg daily, checking lipid panel and adjust as needed.  

## 2019-06-12 ENCOUNTER — Telehealth: Payer: Self-pay | Admitting: *Deleted

## 2019-06-12 NOTE — Telephone Encounter (Signed)
Omeprazole 20 mg cap PA is approved for #180/90 days 06/12/19 until 06/11/20. Pt informed.

## 2019-06-16 ENCOUNTER — Other Ambulatory Visit: Payer: Self-pay | Admitting: Internal Medicine

## 2019-06-18 ENCOUNTER — Other Ambulatory Visit: Payer: Self-pay

## 2019-06-18 ENCOUNTER — Ambulatory Visit (INDEPENDENT_AMBULATORY_CARE_PROVIDER_SITE_OTHER): Payer: 59

## 2019-06-18 DIAGNOSIS — Z299 Encounter for prophylactic measures, unspecified: Secondary | ICD-10-CM

## 2019-06-18 DIAGNOSIS — Z23 Encounter for immunization: Secondary | ICD-10-CM

## 2020-04-17 ENCOUNTER — Other Ambulatory Visit: Payer: Self-pay | Admitting: Internal Medicine

## 2020-05-02 ENCOUNTER — Other Ambulatory Visit: Payer: Self-pay

## 2020-05-02 ENCOUNTER — Ambulatory Visit (INDEPENDENT_AMBULATORY_CARE_PROVIDER_SITE_OTHER): Payer: 59 | Admitting: Internal Medicine

## 2020-05-02 ENCOUNTER — Encounter: Payer: Self-pay | Admitting: Internal Medicine

## 2020-05-02 VITALS — BP 128/72 | HR 88 | Temp 98.2°F | Resp 16 | Ht 70.0 in | Wt 190.0 lb

## 2020-05-02 DIAGNOSIS — R7303 Prediabetes: Secondary | ICD-10-CM | POA: Insufficient documentation

## 2020-05-02 DIAGNOSIS — I10 Essential (primary) hypertension: Secondary | ICD-10-CM | POA: Diagnosis not present

## 2020-05-02 DIAGNOSIS — K219 Gastro-esophageal reflux disease without esophagitis: Secondary | ICD-10-CM

## 2020-05-02 DIAGNOSIS — Z23 Encounter for immunization: Secondary | ICD-10-CM

## 2020-05-02 DIAGNOSIS — E782 Mixed hyperlipidemia: Secondary | ICD-10-CM

## 2020-05-02 DIAGNOSIS — E559 Vitamin D deficiency, unspecified: Secondary | ICD-10-CM

## 2020-05-02 DIAGNOSIS — Z0000 Encounter for general adult medical examination without abnormal findings: Secondary | ICD-10-CM | POA: Diagnosis not present

## 2020-05-02 DIAGNOSIS — E785 Hyperlipidemia, unspecified: Secondary | ICD-10-CM

## 2020-05-02 MED ORDER — SIMVASTATIN 40 MG PO TABS: 40.0000 mg | ORAL_TABLET | Freq: Every day | ORAL | 1 refills | Status: DC

## 2020-05-02 MED ORDER — VALSARTAN-HYDROCHLOROTHIAZIDE 80-12.5 MG PO TABS: 1.0000 | ORAL_TABLET | Freq: Every day | ORAL | 1 refills | Status: DC

## 2020-05-02 MED ORDER — OMEPRAZOLE 20 MG PO CPDR: 20.0000 mg | DELAYED_RELEASE_CAPSULE | Freq: Two times a day (BID) | ORAL | 1 refills | Status: DC

## 2020-05-02 NOTE — Progress Notes (Signed)
Subjective:  Patient ID: Michael Shepard, male    DOB: [birth year]  Age: 64 y.o. MRN: [id number]  CC: Annual Exam, Hypertension, and Hyperlipidemia  This visit occurred during the SARS-CoV-2 public health emergency.  Safety protocols were in place, including screening questions prior to the visit, additional usage of staff PPE, and extensive cleaning of exam room while observing appropriate contact time as indicated for disinfecting solutions.    HPI Michael Shepard presents for a CPX.  He tells me his blood pressure has been well controlled on the current regimen.  He is active and denies any recent episodes of chest pain, shortness of breath, palpitations, edema, fatigue, dizziness, or lightheadedness.  History Hilton has a past medical history of Allergy, Cerebral AVM, Esophageal reflux, Generalized anxiety disorder, Other and unspecified hyperlipidemia, Sleep apnea, and Unspecified essential hypertension.   He has a past surgical history that includes Hemorrhoid surgery (1998); Wisdom tooth extraction; and Colonoscopy (11-22-04).   His family history includes Coronary artery disease in his maternal grandfather; Diabetes in his father and another family member; Migraines in his mother; Ovarian cancer in his mother.He reports that he quit smoking about 14 years ago. He has never used smokeless tobacco. He reports current alcohol use of about 7.0 standard drinks of alcohol per week. He reports that he does not use drugs.  Outpatient Medications Prior to Visit  Medication Sig Dispense Refill  . OVER THE COUNTER MEDICATION Turmeric, 1000 mg one tablet daily.    Marland Kitchen OVER THE COUNTER MEDICATION Feverfew, 380 mg one tablet twice daily.    Marland Kitchen omeprazole (PRILOSEC) 20 MG capsule Take 1 capsule (20 mg total) by mouth 2 (two) times daily before a meal. 180 capsule 3  . simvastatin (ZOCOR) 40 MG tablet Take 1 tablet (40 mg total) by mouth at bedtime. 90 tablet 3  . valsartan-hydrochlorothiazide  (DIOVAN-HCT) 80-12.5 MG tablet Take 1 tablet by mouth daily. 90 tablet 3  . tamsulosin (FLOMAX) 0.4 MG CAPS capsule TAKE 1 CAPSULE BY MOUTH EVERYDAY AT BEDTIME     No facility-administered medications prior to visit.    ROS Review of Systems  Constitutional: Negative.  Negative for appetite change, diaphoresis, fatigue and unexpected weight change.  HENT: Negative.   Eyes: Negative.   Respiratory: Negative for cough, chest tightness, shortness of breath and wheezing.   Cardiovascular: Negative for chest pain, palpitations and leg swelling.  Gastrointestinal: Negative for abdominal pain, blood in stool, constipation, diarrhea, nausea and vomiting.  Endocrine: Negative.   Genitourinary: Negative.  Negative for difficulty urinating, hematuria, scrotal swelling, testicular pain and urgency.  Musculoskeletal: Negative for arthralgias and myalgias.  Skin: Negative.  Negative for color change.  Neurological: Negative.  Negative for dizziness, facial asymmetry, weakness and headaches.  Hematological: Negative for adenopathy. Does not bruise/bleed easily.  Psychiatric/Behavioral: Negative.   All other systems reviewed and are negative.   Objective:  BP 128/72   Pulse 88   Temp 98.2 F (36.8 C) (Oral)   Resp 16   Ht 5\' 10"  (1.778 m)   Wt 190 lb (86.2 kg)   SpO2 96%   BMI 27.26 kg/m   Physical Exam Vitals reviewed.  Constitutional:      Appearance: Normal appearance.  HENT:     Nose: Nose normal.     Mouth/Throat:     Mouth: Mucous membranes are moist.  Eyes:     General: No scleral icterus.    Conjunctiva/sclera: Conjunctivae normal.  Cardiovascular:     Rate and  Rhythm: Normal rate and regular rhythm.     Heart sounds: Normal heart sounds, S1 normal and S2 normal. No murmur heard.      Comments: EKG -  NSR, 67 bpm No LVH Normal EKG Pulmonary:     Effort: Pulmonary effort is normal.     Breath sounds: No stridor. No wheezing, rhonchi or rales.  Abdominal:     General:  Abdomen is flat.     Palpations: There is no mass.     Tenderness: There is no abdominal tenderness. There is no guarding.     Hernia: There is no hernia in the left inguinal area or right inguinal area.  Genitourinary:    Pubic Area: No rash.      Penis: Normal. No discharge, swelling or lesions.      Testes: Normal.        Right: Mass, tenderness or swelling not present.        Left: Mass, tenderness or swelling not present.     Epididymis:     Right: Normal. Not inflamed or enlarged. No mass.     Left: Normal. Not inflamed or enlarged. No mass.     Prostate: Enlarged (1+ smooth symm BPH). Not tender and no nodules present.     Rectum: Normal. Guaiac result negative. No mass, tenderness, anal fissure, external hemorrhoid or internal hemorrhoid. Normal anal tone.  Musculoskeletal:        General: No swelling.     Cervical back: Neck supple.     Right lower leg: No edema.     Left lower leg: No edema.  Lymphadenopathy:     Cervical: No cervical adenopathy.     Lower Body: No right inguinal adenopathy. No left inguinal adenopathy.  Skin:    General: Skin is warm and dry.     Coloration: Skin is not jaundiced.  Neurological:     General: No focal deficit present.     Mental Status: He is alert and oriented to person, place, and time. Mental status is at baseline.  Psychiatric:        Mood and Affect: Mood normal.        Behavior: Behavior normal.        Thought Content: Thought content normal.        Judgment: Judgment normal.     Lab Results  Component Value Date   WBC 4.1 05/02/2020   HGB 13.9 05/02/2020   HCT 40.5 05/02/2020   PLT 217 05/02/2020   GLUCOSE 97 05/02/2020   CHOL 179 05/02/2020   TRIG 91 05/02/2020   HDL 60 05/02/2020   LDLDIRECT 135.0 04/17/2019   LDLCALC 100 (H) 05/02/2020   ALT 22 05/02/2020   AST 19 05/02/2020   NA 139 05/02/2020   K 4.1 05/02/2020   CL 101 05/02/2020   CREATININE 0.97 05/02/2020   BUN 17 05/02/2020   CO2 31 05/02/2020   TSH  2.29 05/02/2020   PSA 3.6 05/02/2020   INR 1.08 05/03/2014   HGBA1C 5.6 05/02/2020   MICROALBUR <0.7 05/30/2015    Assessment & Plan:   Michael Shepard was seen today for annual exam, hypertension and hyperlipidemia.  Diagnoses and all orders for this visit:  Essential hypertension- His blood pressure is well controlled.  Electrolytes and renal function are normal.  His EKG is reassuring.  Will continue the combination of an ARB and thiazide diuretic. -     CBC with Differential/Platelet; Future -     BASIC METABOLIC PANEL WITH  GFR; Future -     TSH; Future -     Urinalysis, Routine w reflex microscopic; Future -     VITAMIN D 25 Hydroxy (Vit-D Deficiency, Fractures); Future -     valsartan-hydrochlorothiazide (DIOVAN-HCT) 80-12.5 MG tablet; Take 1 tablet by mouth daily. -     VITAMIN D 25 Hydroxy (Vit-D Deficiency, Fractures) -     Urinalysis, Routine w reflex microscopic -     TSH -     BASIC METABOLIC PANEL WITH GFR -     CBC with Differential/Platelet -     EKG 12-Lead  Routine health maintenance- Exam completed, labs reviewed, vaccines reviewed and updated, cancer screenings are up-to-date, patient education was given. -     Lipid panel; Future -     PSA; Future -     PSA -     Lipid panel  Prediabetes- His blood sugars are normal now. -     BASIC METABOLIC PANEL WITH GFR; Future -     Hemoglobin A1c; Future -     Hemoglobin A1c -     BASIC METABOLIC PANEL WITH GFR  Mixed hyperlipidemia -     Hepatic function panel; Future -     Hepatic function panel  Dyslipidemia, goal LDL below 100- He has achieved his LDL goal is doing well on the statin. -     simvastatin (ZOCOR) 40 MG tablet; Take 1 tablet (40 mg total) by mouth at bedtime.  Gastroesophageal reflux disease without esophagitis- His symptoms are well controlled with the PPI.  No complications noted. -     omeprazole (PRILOSEC) 20 MG capsule; Take 1 capsule (20 mg total) by mouth 2 (two) times daily before a  meal.  Vitamin D insufficiency -     Cholecalciferol 50 MCG (2000 UT) TABS; Take 1 tablet (2,000 Units total) by mouth daily.  Other orders -     Flu Vaccine QUAD 6+ mos PF IM (Fluarix Quad PF)   I have discontinued Lujean Amel "Mark"'s tamsulosin. I am also having him start on Cholecalciferol. Additionally, I am having him maintain his [place], simvastatin, omeprazole, and valsartan-hydrochlorothiazide.  Meds ordered this encounter  Medications  . simvastatin (ZOCOR) 40 MG tablet    Sig: Take 1 tablet (40 mg total) by mouth at bedtime.    Dispense:  90 tablet    Refill:  1  . omeprazole (PRILOSEC) 20 MG capsule    Sig: Take 1 capsule (20 mg total) by mouth 2 (two) times daily before a meal.    Dispense:  180 capsule    Refill:  1  . valsartan-hydrochlorothiazide (DIOVAN-HCT) 80-12.5 MG tablet    Sig: Take 1 tablet by mouth daily.    Dispense:  90 tablet    Refill:  1  . Cholecalciferol 50 MCG (2000 UT) TABS    Sig: Take 1 tablet (2,000 Units total) by mouth daily.    Dispense:  90 tablet    Refill:  1     Follow-up: Return in about 6 months (around 10/31/2020).  Scarlette Calico, MD

## 2020-05-02 NOTE — Patient Instructions (Signed)

## 2020-05-03 ENCOUNTER — Encounter: Payer: Self-pay | Admitting: Internal Medicine

## 2020-05-03 LAB — CBC WITH DIFFERENTIAL/PLATELET
Absolute Monocytes: 340 cells/uL (ref 200–950)
Basophils Absolute: 49 cells/uL (ref 0–200)
Basophils Relative: 1.2 %
Eosinophils Absolute: 152 cells/uL (ref 15–500)
Eosinophils Relative: 3.7 %
HCT: 40.5 % (ref 38.5–50.0)
Hemoglobin: 13.9 g/dL (ref 13.2–17.1)
Lymphs Abs: 1095 cells/uL (ref 850–3900)
MCH: 31 pg (ref 27.0–33.0)
MCHC: 34.3 g/dL (ref 32.0–36.0)
MCV: 90.2 fL (ref 80.0–100.0)
MPV: 9.1 fL (ref 7.5–12.5)
Monocytes Relative: 8.3 %
Neutro Abs: 2464 cells/uL (ref 1500–7800)
Neutrophils Relative %: 60.1 %
Platelets: 217 10*3/uL (ref 140–400)
RBC: 4.49 10*6/uL (ref 4.20–5.80)
RDW: 12.3 % (ref 11.0–15.0)
Total Lymphocyte: 26.7 %
WBC: 4.1 10*3/uL (ref 3.8–10.8)

## 2020-05-03 LAB — BASIC METABOLIC PANEL WITH GFR
BUN: 17 mg/dL (ref 7–25)
CO2: 31 mmol/L (ref 20–32)
Calcium: 9.3 mg/dL (ref 8.6–10.3)
Chloride: 101 mmol/L (ref 98–110)
Creat: 0.97 mg/dL (ref 0.70–1.25)
GFR, Est African American: 97 mL/min/{1.73_m2} (ref >=60)
GFR, Est Non African American: 83 mL/min/{1.73_m2} (ref >=60)
Glucose, Bld: 97 mg/dL (ref 65–99)
Potassium: 4.1 mmol/L (ref 3.5–5.3)
Sodium: 139 mmol/L (ref 135–146)

## 2020-05-03 LAB — PSA: PSA: 3.6 ng/mL (ref <=4.0)

## 2020-05-03 LAB — LIPID PANEL
Cholesterol: 179 mg/dL (ref <200)
HDL: 60 mg/dL (ref >=40)
LDL Cholesterol (Calc): 100 mg/dL (calc) — ABNORMAL HIGH
Non-HDL Cholesterol (Calc): 119 mg/dL (calc) (ref <130)
Total CHOL/HDL Ratio: 3 (calc) (ref <5.0)
Triglycerides: 91 mg/dL (ref <150)

## 2020-05-03 LAB — HEPATIC FUNCTION PANEL
AG Ratio: 2 (calc) (ref 1.0–2.5)
ALT: 22 U/L (ref 9–46)
AST: 19 U/L (ref 10–35)
Albumin: 4.4 g/dL (ref 3.6–5.1)
Alkaline phosphatase (APISO): 55 U/L (ref 35–144)
Bilirubin, Direct: 0.1 mg/dL (ref 0.0–0.2)
Globulin: 2.2 g/dL (calc) (ref 1.9–3.7)
Indirect Bilirubin: 0.6 mg/dL (calc) (ref 0.2–1.2)
Total Bilirubin: 0.7 mg/dL (ref 0.2–1.2)
Total Protein: 6.6 g/dL (ref 6.1–8.1)

## 2020-05-03 LAB — URINALYSIS, ROUTINE W REFLEX MICROSCOPIC
Bilirubin Urine: NEGATIVE
Glucose, UA: NEGATIVE
Hgb urine dipstick: NEGATIVE
Ketones, ur: NEGATIVE
Leukocytes,Ua: NEGATIVE
Nitrite: NEGATIVE
Protein, ur: NEGATIVE
Specific Gravity, Urine: 1.027 (ref 1.001–1.03)
pH: 6.5 (ref 5.0–8.0)

## 2020-05-03 LAB — HEMOGLOBIN A1C
Hgb A1c MFr Bld: 5.6 % of total Hgb (ref <5.7)
Mean Plasma Glucose: 114 (calc)
eAG (mmol/L): 6.3 (calc)

## 2020-05-03 LAB — TSH: TSH: 2.29 mIU/L (ref 0.40–4.50)

## 2020-05-03 LAB — VITAMIN D 25 HYDROXY (VIT D DEFICIENCY, FRACTURES): Vit D, 25-Hydroxy: 24 ng/mL — ABNORMAL LOW (ref 30–100)

## 2020-05-03 MED ORDER — CHOLECALCIFEROL 50 MCG (2000 UT) PO TABS: 1.0000 | ORAL_TABLET | Freq: Every day | ORAL | 1 refills | Status: DC

## 2020-05-14 ENCOUNTER — Other Ambulatory Visit: Payer: Self-pay | Admitting: Internal Medicine

## 2020-05-14 DIAGNOSIS — I10 Essential (primary) hypertension: Secondary | ICD-10-CM

## 2020-07-16 ENCOUNTER — Other Ambulatory Visit: Payer: Self-pay | Admitting: Internal Medicine

## 2020-07-16 DIAGNOSIS — K219 Gastro-esophageal reflux disease without esophagitis: Secondary | ICD-10-CM

## 2020-08-23 ENCOUNTER — Other Ambulatory Visit: Payer: Self-pay | Admitting: Internal Medicine

## 2020-08-23 DIAGNOSIS — K219 Gastro-esophageal reflux disease without esophagitis: Secondary | ICD-10-CM

## 2020-09-12 ENCOUNTER — Telehealth: Payer: Self-pay | Admitting: Internal Medicine

## 2020-09-12 NOTE — Telephone Encounter (Signed)
omeprazole (PRILOSEC) 20 MG capsule Patient states [employer] reached out and made him aware he needs a prior authorization to be completed for this medication to be filled.

## 2020-09-14 NOTE — Telephone Encounter (Signed)
PA initiated via CoverMyMeds Key: [id number]   Your PA has been resolved, no additional PA is required. For further inquiries please contact the number on the back of the member prescription card. (Message [phone])

## 2020-09-14 NOTE — Telephone Encounter (Signed)
I called [employer] @ [phone] to make sure PA was not needed as stated on Covermymeds.  They are unable to initiate PA for omeprazole 20 mg 1 bid. She suggests changing the rx to 40 mg 1qd. Is this ok?

## 2020-09-15 NOTE — Telephone Encounter (Signed)
Patient can just purchase rx or otc for same. Call back if too expensive

## 2020-09-16 NOTE — Telephone Encounter (Signed)
Pt informed of below.  

## 2020-09-29 ENCOUNTER — Other Ambulatory Visit: Payer: Self-pay | Admitting: Internal Medicine

## 2020-09-29 DIAGNOSIS — E785 Hyperlipidemia, unspecified: Secondary | ICD-10-CM

## 2020-10-14 ENCOUNTER — Other Ambulatory Visit: Payer: Self-pay | Admitting: Internal Medicine

## 2020-10-14 DIAGNOSIS — E559 Vitamin D deficiency, unspecified: Secondary | ICD-10-CM

## 2020-11-09 ENCOUNTER — Telehealth: Payer: Self-pay | Admitting: Internal Medicine

## 2020-11-09 DIAGNOSIS — I10 Essential (primary) hypertension: Secondary | ICD-10-CM

## 2020-11-09 DIAGNOSIS — E785 Hyperlipidemia, unspecified: Secondary | ICD-10-CM

## 2020-11-09 MED ORDER — SIMVASTATIN 40 MG PO TABS: 40.0000 mg | ORAL_TABLET | Freq: Every day | ORAL | 0 refills | Status: DC

## 2020-11-09 MED ORDER — VALSARTAN-HYDROCHLOROTHIAZIDE 80-12.5 MG PO TABS: 1.0000 | ORAL_TABLET | Freq: Every day | ORAL | 0 refills | Status: DC

## 2020-11-09 NOTE — Telephone Encounter (Signed)
   Patient requesting refill for  simvastatin (ZOCOR) 40 MG tablet valsartan-hydrochlorothiazide (DIOVAN-HCT) 80-12.5 MG tablet omeprazole (PRILOSEC) 20 MG capsule   Pharmacy [facility]

## 2020-11-09 NOTE — Telephone Encounter (Signed)
Medication has been sent to the patient's pharmacy.  

## 2020-11-23 ENCOUNTER — Ambulatory Visit: Payer: 59 | Admitting: Internal Medicine

## 2020-11-23 ENCOUNTER — Encounter: Payer: Self-pay | Admitting: Internal Medicine

## 2020-11-23 ENCOUNTER — Other Ambulatory Visit: Payer: Self-pay

## 2020-11-23 VITALS — BP 126/78 | HR 90 | Temp 98.3°F | Resp 18 | Ht 70.0 in | Wt 197.6 lb

## 2020-11-23 DIAGNOSIS — K219 Gastro-esophageal reflux disease without esophagitis: Secondary | ICD-10-CM | POA: Diagnosis not present

## 2020-11-23 DIAGNOSIS — I10 Essential (primary) hypertension: Secondary | ICD-10-CM

## 2020-11-23 DIAGNOSIS — E785 Hyperlipidemia, unspecified: Secondary | ICD-10-CM | POA: Diagnosis not present

## 2020-11-23 DIAGNOSIS — J3089 Other allergic rhinitis: Secondary | ICD-10-CM

## 2020-11-23 MED ORDER — OMEPRAZOLE 40 MG PO CPDR: 40.0000 mg | DELAYED_RELEASE_CAPSULE | Freq: Every day | ORAL | 3 refills | Status: DC

## 2020-11-23 MED ORDER — FLUTICASONE PROPIONATE 50 MCG/ACT NA SUSP: 2.0000 | Freq: Every day | NASAL | 3 refills | Status: DC

## 2020-11-23 MED ORDER — VALSARTAN-HYDROCHLOROTHIAZIDE 80-12.5 MG PO TABS: 1.0000 | ORAL_TABLET | Freq: Every day | ORAL | 3 refills | Status: DC

## 2020-11-23 MED ORDER — SIMVASTATIN 40 MG PO TABS
40.0000 mg | ORAL_TABLET | Freq: Every day | ORAL | 3 refills | Status: DC
Start: 2020-11-23 — End: 2021-11-16

## 2020-11-23 NOTE — Progress Notes (Signed)
   Subjective:   Patient ID: Michael Shepard, male    DOB: 28-Jan-1956, 65 y.o.   MRN: [id number]  HPI The patient is a 65 YO man coming in for concerns about GERD. Was taking omeprazole 20 mg BID and now his insurance will not cover this. He is not able to stop as he tried this and tried going to once a day and got recurrent symptoms. Denies abdominal pain. Denies blood in stool.   Review of Systems  Constitutional: Negative.   HENT: Negative.   Eyes: Negative.   Respiratory: Negative for cough, chest tightness and shortness of breath.   Cardiovascular: Negative for chest pain, palpitations and leg swelling.  Gastrointestinal: Negative for abdominal distention, abdominal pain, constipation, diarrhea, nausea and vomiting.       GERD  Musculoskeletal: Negative.   Skin: Negative.   Neurological: Negative.   Psychiatric/Behavioral: Negative.     Objective:  Physical Exam Constitutional:      Appearance: He is well-developed.  HENT:     Head: Normocephalic and atraumatic.  Cardiovascular:     Rate and Rhythm: Normal rate and regular rhythm.  Pulmonary:     Effort: Pulmonary effort is normal. No respiratory distress.     Breath sounds: Normal breath sounds. No wheezing or rales.  Abdominal:     General: Bowel sounds are normal. There is no distension.     Palpations: Abdomen is soft.     Tenderness: There is no abdominal tenderness. There is no rebound.  Musculoskeletal:     Cervical back: Normal range of motion.  Skin:    General: Skin is warm and dry.  Neurological:     Mental Status: He is alert and oriented to person, place, and time.     Coordination: Coordination normal.     Vitals:   11/23/20 1020  BP: 126/78  Pulse: 90  Resp: 18  Temp: 98.3 F (36.8 C)  TempSrc: Oral  SpO2: 96%  Weight: 197 lb 9.6 oz (89.6 kg)  Height: 5\' 10"  (1.778 m)    This visit occurred during the SARS-CoV-2 public health emergency.  Safety protocols were in place, including screening  questions prior to the visit, additional usage of staff PPE, and extensive cleaning of exam room while observing appropriate contact time as indicated for disinfecting solutions.   Assessment & Plan:

## 2020-11-23 NOTE — Patient Instructions (Signed)
We will get you in the allergy specialist and sent in flonase to try in the meantime.   We have sent in the 40 mg omeprazole to take daily to see if this works as well.

## 2020-11-24 ENCOUNTER — Other Ambulatory Visit: Payer: Self-pay | Admitting: Internal Medicine

## 2020-11-25 NOTE — Assessment & Plan Note (Signed)
Rx omeprazole 40 mg daily instead to see if this can help and be covered by insurance. Can change if not covered.

## 2020-11-25 NOTE — Assessment & Plan Note (Signed)
Wants to go back to allergy specialist for consideration of shots.

## 2020-12-21 ENCOUNTER — Telehealth: Payer: Self-pay | Admitting: Internal Medicine

## 2020-12-21 NOTE — Telephone Encounter (Signed)
   Patient requesting prior auth be obtained for omeprazole (PRILOSEC) 40 MG capsule   Call [phone]

## 2020-12-27 NOTE — Telephone Encounter (Signed)
  Follow up message   Patient calling for status of prior auth for Omeprazole

## 2020-12-28 NOTE — Telephone Encounter (Signed)
Spoke with Michael Shepard at [facility] to initiate the prior authorization for Omeprazole 40 mg. She stated the pharmacy had the wrong fax number on file, which she updated. GERD for dx. Mediation has been approved through 12/28/2021.  Spoke with the patient to make him aware of the approval. He was very appreciative for the call. No other questions or concerns at this time.

## 2021-01-11 ENCOUNTER — Ambulatory Visit: Payer: Self-pay | Admitting: Allergy

## 2021-02-09 ENCOUNTER — Other Ambulatory Visit: Payer: Self-pay | Admitting: Urology

## 2021-02-09 DIAGNOSIS — R972 Elevated prostate specific antigen [PSA]: Secondary | ICD-10-CM

## 2021-02-25 ENCOUNTER — Other Ambulatory Visit: Payer: 59

## 2021-03-24 ENCOUNTER — Telehealth: Payer: Self-pay | Admitting: Internal Medicine

## 2021-03-24 NOTE — Telephone Encounter (Signed)
    Pharmacy calling to report medication on backorder until August  triamcinolone (NASACORT) 55 MCG/ACT AERO nasal inhaler

## 2021-03-24 NOTE — Telephone Encounter (Signed)
Unless there is hx of significant nose bleeds, pt can take OTC Flonase asd, thanks

## 2021-03-24 NOTE — Telephone Encounter (Signed)
What else can be called in? Please advise

## 2021-03-27 NOTE — Telephone Encounter (Signed)
Called patient. LVM with Dr. Gwynn Burly recommendations. Office number was provided in case of additional questions or concerns.

## 2021-04-12 ENCOUNTER — Ambulatory Visit
Admission: RE | Admit: 2021-04-12 | Discharge: 2021-04-12 | Disposition: A | Discharge status: Home / Self Care | Payer: 59 | Source: Ambulatory Visit | Attending: Urology | Admitting: Urology

## 2021-04-12 DIAGNOSIS — R972 Elevated prostate specific antigen [PSA]: Secondary | ICD-10-CM

## 2021-04-12 MED ORDER — GADOBENATE DIMEGLUMINE 529 MG/ML IV SOLN
18.0000 mL | Freq: Once | INTRAVENOUS | Status: AC | PRN
  Administered 2021-04-12: 18 mL via INTRAVENOUS

## 2021-09-20 ENCOUNTER — Other Ambulatory Visit: Payer: Self-pay | Admitting: Internal Medicine

## 2021-11-16 ENCOUNTER — Other Ambulatory Visit: Payer: Self-pay | Admitting: Internal Medicine

## 2021-12-07 ENCOUNTER — Encounter: Payer: Self-pay | Admitting: Internal Medicine

## 2021-12-07 ENCOUNTER — Ambulatory Visit (INDEPENDENT_AMBULATORY_CARE_PROVIDER_SITE_OTHER): Payer: 59 | Admitting: Internal Medicine

## 2021-12-07 VITALS — BP 130/70 | HR 84 | Resp 18 | Ht 70.0 in | Wt 196.4 lb

## 2021-12-07 DIAGNOSIS — R7303 Prediabetes: Secondary | ICD-10-CM

## 2021-12-07 DIAGNOSIS — Z0000 Encounter for general adult medical examination without abnormal findings: Secondary | ICD-10-CM | POA: Diagnosis not present

## 2021-12-07 DIAGNOSIS — E785 Hyperlipidemia, unspecified: Secondary | ICD-10-CM

## 2021-12-07 DIAGNOSIS — K219 Gastro-esophageal reflux disease without esophagitis: Secondary | ICD-10-CM

## 2021-12-07 DIAGNOSIS — I10 Essential (primary) hypertension: Secondary | ICD-10-CM | POA: Diagnosis not present

## 2021-12-07 DIAGNOSIS — E782 Mixed hyperlipidemia: Secondary | ICD-10-CM

## 2021-12-07 DIAGNOSIS — G4733 Obstructive sleep apnea (adult) (pediatric): Secondary | ICD-10-CM

## 2021-12-07 DIAGNOSIS — Z9989 Dependence on other enabling machines and devices: Secondary | ICD-10-CM

## 2021-12-07 LAB — CBC
HCT: 41.6 % (ref 39.0–52.0)
Hemoglobin: 14.3 g/dL (ref 13.0–17.0)
MCHC: 34.3 g/dL (ref 30.0–36.0)
MCV: 91.8 fl (ref 78.0–100.0)
Platelets: 249 10*3/uL (ref 150.0–400.0)
RBC: 4.53 Mil/uL (ref 4.22–5.81)
RDW: 13 % (ref 11.5–15.5)
WBC: 4.4 10*3/uL (ref 4.0–10.5)

## 2021-12-07 LAB — LIPID PANEL
Cholesterol: 221 mg/dL — ABNORMAL HIGH (ref 0–200)
HDL: 67.3 mg/dL (ref >39.00)
LDL Cholesterol: 138 mg/dL — ABNORMAL HIGH (ref 0–99)
NonHDL: 153.46
Total CHOL/HDL Ratio: 3
Triglycerides: 78 mg/dL (ref 0.0–149.0)
VLDL: 15.6 mg/dL (ref 0.0–40.0)

## 2021-12-07 LAB — COMPREHENSIVE METABOLIC PANEL
ALT: 26 U/L (ref 0–53)
AST: 25 U/L (ref 0–37)
Albumin: 4.7 g/dL (ref 3.5–5.2)
Alkaline Phosphatase: 56 U/L (ref 39–117)
BUN: 15 mg/dL (ref 6–23)
CO2: 26 mEq/L (ref 19–32)
Calcium: 9.5 mg/dL (ref 8.4–10.5)
Chloride: 104 mEq/L (ref 96–112)
Creatinine, Ser: 0.94 mg/dL (ref 0.40–1.50)
GFR: 86.02 mL/min (ref >60.00)
Glucose, Bld: 115 mg/dL — ABNORMAL HIGH (ref 70–99)
Potassium: 4.5 mEq/L (ref 3.5–5.1)
Sodium: 141 mEq/L (ref 135–145)
Total Bilirubin: 0.5 mg/dL (ref 0.2–1.2)
Total Protein: 6.8 g/dL (ref 6.0–8.3)

## 2021-12-07 LAB — PSA: PSA: 3.46 ng/mL (ref 0.10–4.00)

## 2021-12-07 LAB — HEMOGLOBIN A1C: Hgb A1c MFr Bld: 6.1 % (ref 4.6–6.5)

## 2021-12-07 MED ORDER — VALSARTAN-HYDROCHLOROTHIAZIDE 80-12.5 MG PO TABS: 1.0000 | ORAL_TABLET | Freq: Every day | ORAL | 3 refills | Status: DC

## 2021-12-07 MED ORDER — SIMVASTATIN 40 MG PO TABS: 40.0000 mg | ORAL_TABLET | Freq: Every day | ORAL | 3 refills | Status: DC

## 2021-12-07 MED ORDER — OMEPRAZOLE 40 MG PO CPDR: 40.0000 mg | DELAYED_RELEASE_CAPSULE | Freq: Every day | ORAL | 3 refills | Status: DC

## 2021-12-07 MED ORDER — TRIAMCINOLONE ACETONIDE 55 MCG/ACT NA AERO: 2.0000 | INHALATION_SPRAY | Freq: Every day | NASAL | 12 refills | Status: DC

## 2021-12-07 NOTE — Assessment & Plan Note (Signed)
Checking Hga1c and adjust as needed.  

## 2021-12-07 NOTE — Assessment & Plan Note (Signed)
Refill omeprazole 40 mg daily. Continue and controlled.  ?

## 2021-12-07 NOTE — Progress Notes (Signed)
? ?  Subjective:  ? ?Patient ID: Michael Shepard, male    DOB: 1955/12/27, 66 y.o.   MRN: [id number] ? ?HPI ?The patient is here for physical. ? ?PMH, [facility], social history reviewed and updated ? ?Review of Systems  ?Constitutional: Negative.   ?HENT: Negative.    ?Eyes: Negative.   ?Respiratory:  Negative for cough, chest tightness and shortness of breath.   ?Cardiovascular:  Negative for chest pain, palpitations and leg swelling.  ?Gastrointestinal:  Negative for abdominal distention, abdominal pain, constipation, diarrhea, nausea and vomiting.  ?Musculoskeletal: Negative.   ?Skin: Negative.   ?Neurological: Negative.   ?Psychiatric/Behavioral: Negative.    ? ?Objective:  ?Physical Exam ?Constitutional:   ?   Appearance: He is well-developed.  ?HENT:  ?   Head: Normocephalic and atraumatic.  ?Cardiovascular:  ?   Rate and Rhythm: Normal rate and regular rhythm.  ?Pulmonary:  ?   Effort: Pulmonary effort is normal. No respiratory distress.  ?   Breath sounds: Normal breath sounds. No wheezing or rales.  ?Abdominal:  ?   General: Bowel sounds are normal. There is no distension.  ?   Palpations: Abdomen is soft.  ?   Tenderness: There is no abdominal tenderness. There is no rebound.  ?Musculoskeletal:  ?   Cervical back: Normal range of motion.  ?Skin: ?   General: Skin is warm and dry.  ?Neurological:  ?   Mental Status: He is alert and oriented to person, place, and time.  ?   Coordination: Coordination normal.  ? ? ?Vitals:  ? 12/07/21 0900  ?BP: 130/70  ?Pulse: 84  ?Resp: 18  ?SpO2: 96%  ?Weight: 196 lb 6.4 oz (89.1 kg)  ?Height: '5\' 10"'$  (1.778 m)  ? ? ?This visit occurred during the SARS-CoV-2 public health emergency.  Safety protocols were in place, including screening questions prior to the visit, additional usage of staff PPE, and extensive cleaning of exam room while observing appropriate contact time as indicated for disinfecting solutions.  ? ?Assessment & Plan:  ? ?

## 2021-12-07 NOTE — Assessment & Plan Note (Signed)
Continue CPAP.  

## 2021-12-07 NOTE — Assessment & Plan Note (Signed)
Flu shot up to date. covid-19 counseled. Shingrix complete. Tetanus up to date. Colonoscopy up to date. Counseled about sun safety and mole surveillance. Counseled about the dangers of distracted driving. Given 10 year screening recommendations.  ? ?

## 2021-12-07 NOTE — Assessment & Plan Note (Signed)
Checking lipid panel, adjust simvastatin 40 mg daily for goal LDL <100. ?

## 2021-12-07 NOTE — Patient Instructions (Signed)
We will check the labs today and have sent in the refills ? ?

## 2021-12-07 NOTE — Assessment & Plan Note (Signed)
BP at goal on valsartan/hctz 80/12.5 mg daily and checking CMP today. Refilled and adjust as needed.  ?

## 2021-12-08 ENCOUNTER — Other Ambulatory Visit: Payer: Self-pay | Admitting: Internal Medicine

## 2022-02-27 ENCOUNTER — Telehealth: Payer: Self-pay | Admitting: Internal Medicine

## 2022-02-27 NOTE — Telephone Encounter (Signed)
PT calls today in regards to their prescription of Omeprazole. PT uses [facility] for their RX and has been informed by them that they will not continue the RX until a PA is given. They have stated getting up with [place] multiple times regarding this (I had let him know I had not seen any of these occurrences).  PT was given a phone number from them that might help expedite the PA. PT would like to be informed if there are any problems with getting medication authorized.  CB for [facility]: [phone]  CB for PT: [phone]

## 2022-02-27 NOTE — Telephone Encounter (Signed)
Also called pt left msg on vm PA approved.Marland KitchenRaechel Chute

## 2022-03-13 ENCOUNTER — Ambulatory Visit: Payer: 59 | Admitting: Internal Medicine

## 2022-03-13 ENCOUNTER — Encounter: Payer: Self-pay | Admitting: Internal Medicine

## 2022-03-13 VITALS — BP 122/68 | HR 73 | Resp 18 | Ht 70.0 in | Wt 190.0 lb

## 2022-03-13 DIAGNOSIS — R0789 Other chest pain: Secondary | ICD-10-CM | POA: Diagnosis not present

## 2022-03-13 NOTE — Patient Instructions (Signed)
The EKG is normal. This is likely from moving and you can use tylenol for pain. It is okay to double the omeprazole for 2 weeks to see if this helps.

## 2022-03-13 NOTE — Progress Notes (Unsigned)
   Subjective:   Patient ID: FUTURE YELDELL, male    DOB: 1956-02-24, 66 y.o.   MRN: [id number]  HPI The patient is a 66 YO man coming in for chest tightness going on for a couple weeks. Lasting 30 minutes or longer at a time. Better with belching. Moving recently and lifting a lot.  Review of Systems  Objective:  Physical Exam  Vitals:   03/13/22 1514  BP: 122/68  Pulse: 73  Resp: 18  SpO2: 95%  Weight: 190 lb (86.2 kg)  Height: '5\' 10"'$  (1.778 m)   EKG: Rate 70, axis normal, interval normal, sinus, no st or t wave changes, no significant change compared to prior 2021   Assessment & Plan:

## 2022-03-14 NOTE — Assessment & Plan Note (Signed)
EKG done today to rule out ACS which is not changed from prior. Suspect GERD as he is having excessive belching and this relieves the chest pain/discomfort. We will double omeprazole to 40 mg BID for 2 weeks and see if this helps. He may also have enlarging hiatal hernia so if not resolved will refer to GI.

## 2022-11-19 ENCOUNTER — Other Ambulatory Visit: Payer: Self-pay | Admitting: Internal Medicine

## 2022-11-19 DIAGNOSIS — E785 Hyperlipidemia, unspecified: Secondary | ICD-10-CM

## 2022-12-12 ENCOUNTER — Encounter: Payer: Self-pay | Admitting: Internal Medicine

## 2023-01-07 ENCOUNTER — Ambulatory Visit (INDEPENDENT_AMBULATORY_CARE_PROVIDER_SITE_OTHER): Payer: Medicare HMO | Admitting: Internal Medicine

## 2023-01-07 ENCOUNTER — Encounter: Payer: Self-pay | Admitting: Internal Medicine

## 2023-01-07 VITALS — BP 124/72 | HR 88 | Temp 98.3°F | Ht 70.0 in | Wt 183.0 lb

## 2023-01-07 DIAGNOSIS — R7303 Prediabetes: Secondary | ICD-10-CM

## 2023-01-07 DIAGNOSIS — K635 Polyp of colon: Secondary | ICD-10-CM

## 2023-01-07 DIAGNOSIS — Z0000 Encounter for general adult medical examination without abnormal findings: Secondary | ICD-10-CM | POA: Diagnosis not present

## 2023-01-07 DIAGNOSIS — I10 Essential (primary) hypertension: Secondary | ICD-10-CM

## 2023-01-07 DIAGNOSIS — E782 Mixed hyperlipidemia: Secondary | ICD-10-CM

## 2023-01-07 DIAGNOSIS — E785 Hyperlipidemia, unspecified: Secondary | ICD-10-CM | POA: Diagnosis not present

## 2023-01-07 DIAGNOSIS — Z23 Encounter for immunization: Secondary | ICD-10-CM | POA: Diagnosis not present

## 2023-01-07 DIAGNOSIS — G4733 Obstructive sleep apnea (adult) (pediatric): Secondary | ICD-10-CM

## 2023-01-07 DIAGNOSIS — K219 Gastro-esophageal reflux disease without esophagitis: Secondary | ICD-10-CM

## 2023-01-07 LAB — COMPREHENSIVE METABOLIC PANEL
ALT: 25 U/L (ref 0–53)
AST: 18 U/L (ref 0–37)
Albumin: 4.5 g/dL (ref 3.5–5.2)
Alkaline Phosphatase: 59 U/L (ref 39–117)
BUN: 18 mg/dL (ref 6–23)
CO2: 31 mEq/L (ref 19–32)
Calcium: 9.6 mg/dL (ref 8.4–10.5)
Chloride: 102 mEq/L (ref 96–112)
Creatinine, Ser: 0.98 mg/dL (ref 0.40–1.50)
GFR: 81.2 mL/min (ref >60.00)
Glucose, Bld: 78 mg/dL (ref 70–99)
Potassium: 4.4 mEq/L (ref 3.5–5.1)
Sodium: 141 mEq/L (ref 135–145)
Total Bilirubin: 0.4 mg/dL (ref 0.2–1.2)
Total Protein: 6.7 g/dL (ref 6.0–8.3)

## 2023-01-07 LAB — CBC
HCT: 43.6 % (ref 39.0–52.0)
Hemoglobin: 14.9 g/dL (ref 13.0–17.0)
MCHC: 34.1 g/dL (ref 30.0–36.0)
MCV: 91.4 fl (ref 78.0–100.0)
Platelets: 255 10*3/uL (ref 150.0–400.0)
RBC: 4.77 Mil/uL (ref 4.22–5.81)
RDW: 13.2 % (ref 11.5–15.5)
WBC: 5.1 10*3/uL (ref 4.0–10.5)

## 2023-01-07 LAB — LIPID PANEL
Cholesterol: 192 mg/dL (ref 0–200)
HDL: 67.3 mg/dL (ref >39.00)
LDL Cholesterol: 91 mg/dL (ref 0–99)
NonHDL: 124.82
Total CHOL/HDL Ratio: 3
Triglycerides: 169 mg/dL — ABNORMAL HIGH (ref 0.0–149.0)
VLDL: 33.8 mg/dL (ref 0.0–40.0)

## 2023-01-07 LAB — PSA: PSA: 2.35 ng/mL (ref 0.10–4.00)

## 2023-01-07 LAB — HEMOGLOBIN A1C: Hgb A1c MFr Bld: 6 % (ref 4.6–6.5)

## 2023-01-07 MED ORDER — OMEPRAZOLE 40 MG PO CPDR: 40.0000 mg | DELAYED_RELEASE_CAPSULE | Freq: Every day | ORAL | 3 refills | Status: DC

## 2023-01-07 MED ORDER — VALSARTAN-HYDROCHLOROTHIAZIDE 80-12.5 MG PO TABS: 1.0000 | ORAL_TABLET | Freq: Every day | ORAL | 3 refills | Status: DC

## 2023-01-07 MED ORDER — SIMVASTATIN 40 MG PO TABS: 40.0000 mg | ORAL_TABLET | Freq: Every day | ORAL | 3 refills | Status: DC

## 2023-01-07 NOTE — Assessment & Plan Note (Signed)
BP at goal taking valsartan/hctz 80/12.5 daily. Checking CMP and adjust as needed.

## 2023-01-07 NOTE — Assessment & Plan Note (Signed)
Checking Hga1c, adjust as needed. Not on medication.

## 2023-01-07 NOTE — Assessment & Plan Note (Signed)
Continue omeprazole 40 mg daily. Well controlled.

## 2023-01-07 NOTE — Assessment & Plan Note (Signed)
Flu shot yearly. Pneumonia given 20 today. Shingrix complete. Tetanus up to date. Colonoscopy referral as due. Counseled about sun safety and mole surveillance. Counseled about the dangers of distracted driving. Given 10 year screening recommendations.

## 2023-01-07 NOTE — Progress Notes (Signed)
   Subjective:   Patient ID: Michael Shepard, male    DOB: 08-31-1956, 67 y.o.   MRN: [id number]  HPI The patient is here for physical.  PMH, [facility], social history reviewed and updated  Review of Systems  Constitutional: Negative.   HENT: Negative.    Eyes: Negative.   Respiratory:  Negative for cough, chest tightness and shortness of breath.   Cardiovascular:  Negative for chest pain, palpitations and leg swelling.  Gastrointestinal:  Negative for abdominal distention, abdominal pain, constipation, diarrhea, nausea and vomiting.  Musculoskeletal: Negative.   Skin: Negative.   Neurological: Negative.   Psychiatric/Behavioral: Negative.      Objective:  Physical Exam Constitutional:      Appearance: He is well-developed.  HENT:     Head: Normocephalic and atraumatic.  Cardiovascular:     Rate and Rhythm: Normal rate and regular rhythm.  Pulmonary:     Effort: Pulmonary effort is normal. No respiratory distress.     Breath sounds: Normal breath sounds. No wheezing or rales.  Abdominal:     General: Bowel sounds are normal. There is no distension.     Palpations: Abdomen is soft.     Tenderness: There is no abdominal tenderness. There is no rebound.  Musculoskeletal:     Cervical back: Normal range of motion.  Skin:    General: Skin is warm and dry.  Neurological:     Mental Status: He is alert and oriented to person, place, and time.     Coordination: Coordination normal.    Vitals:   01/07/23 1001  BP: 124/72  Pulse: 88  Temp: 98.3 F (36.8 C)  TempSrc: Oral  SpO2: 97%  Weight: 183 lb (83 kg)  Height: 5\' 10"  (1.778 m)    Assessment & Plan:  Prevnar 20 given at visit

## 2023-01-07 NOTE — Assessment & Plan Note (Signed)
Checking lipid panel and adjust simvastatin 40 mg daily as needed.  

## 2023-01-07 NOTE — Assessment & Plan Note (Signed)
Continue CPAP lifelong.

## 2023-01-18 ENCOUNTER — Encounter: Payer: Self-pay | Admitting: Internal Medicine

## 2023-03-21 ENCOUNTER — Ambulatory Visit (AMBULATORY_SURGERY_CENTER): Payer: Medicare HMO | Admitting: *Deleted

## 2023-03-21 VITALS — Ht 70.0 in | Wt 182.0 lb

## 2023-03-21 DIAGNOSIS — Z86010 Personal history of colonic polyps: Secondary | ICD-10-CM

## 2023-03-21 MED ORDER — NA SULFATE-K SULFATE-MG SULF 17.5-3.13-1.6 GM/177ML PO SOLN
1.0000 | Freq: Once | ORAL | 0 refills | Status: AC
Start: 2023-03-21 — End: 2023-03-21

## 2023-03-21 NOTE — Progress Notes (Signed)
Pre visit completed in person. Instructions mailed and forwarded through MyChart. Coupon included.   No egg or soy allergy known to patient  No issues known to pt with past sedation with any surgeries or procedures Patient denies ever being told they had issues or difficulty with intubation  No FH of Malignant Hyperthermia Pt is not on diet pills Pt is not on  home 02  Pt is not on blood thinners  Pt denies issues with constipation  No A fib or A flutter Have any cardiac testing pending--NO Pt instructed to use [email or web address] or GoodRx for a price reduction on prep

## 2023-03-25 ENCOUNTER — Encounter: Payer: Self-pay | Admitting: Internal Medicine

## 2023-04-11 ENCOUNTER — Encounter: Payer: Self-pay | Admitting: Internal Medicine

## 2023-04-11 ENCOUNTER — Ambulatory Visit (AMBULATORY_SURGERY_CENTER): Payer: Medicare HMO | Admitting: Internal Medicine

## 2023-04-11 VITALS — BP 107/64 | HR 61 | Temp 97.5°F | Resp 15 | Ht 70.0 in | Wt 182.0 lb

## 2023-04-11 DIAGNOSIS — K635 Polyp of colon: Secondary | ICD-10-CM | POA: Diagnosis not present

## 2023-04-11 DIAGNOSIS — D122 Benign neoplasm of ascending colon: Secondary | ICD-10-CM | POA: Diagnosis not present

## 2023-04-11 DIAGNOSIS — Z86010 Personal history of colonic polyps: Secondary | ICD-10-CM

## 2023-04-11 DIAGNOSIS — Z09 Encounter for follow-up examination after completed treatment for conditions other than malignant neoplasm: Secondary | ICD-10-CM | POA: Diagnosis not present

## 2023-04-11 DIAGNOSIS — D124 Benign neoplasm of descending colon: Secondary | ICD-10-CM

## 2023-04-11 MED ORDER — SODIUM CHLORIDE 0.9 % IV SOLN: 500.0000 mL | Freq: Once | INTRAVENOUS | Status: DC

## 2023-04-11 NOTE — Progress Notes (Signed)
Report to PACU, RN, vss, BBS= Clear.  

## 2023-04-11 NOTE — Patient Instructions (Signed)
Handout on hemorrhoids, diverticulosis, and polyps given to patient. Await pathology results. Resume previous diet and continue present medications. Repeat colonoscopy for surveillance will be determined based off of pathology results.   YOU HAD AN ENDOSCOPIC PROCEDURE TODAY AT THE [facility]:   Refer to the procedure report that was given to you for any specific questions about what was found during the examination.  If the procedure report does not answer your questions, please call your gastroenterologist to clarify.  If you requested that your care partner not be given the details of your procedure findings, then the procedure report has been included in a sealed envelope for you to review at your convenience later.  YOU SHOULD EXPECT: Some feelings of bloating in the abdomen. Passage of more gas than usual.  Walking can help get rid of the air that was put into your GI tract during the procedure and reduce the bloating. If you had a lower endoscopy (such as a colonoscopy or flexible sigmoidoscopy) you may notice spotting of blood in your stool or on the toilet paper. If you underwent a bowel prep for your procedure, you may not have a normal bowel movement for a few days.  Please Note:  You might notice some irritation and congestion in your nose or some drainage.  This is from the oxygen used during your procedure.  There is no need for concern and it should clear up in a day or so.  SYMPTOMS TO REPORT IMMEDIATELY:  Following lower endoscopy (colonoscopy or flexible sigmoidoscopy):  Excessive amounts of blood in the stool  Significant tenderness or worsening of abdominal pains  Swelling of the abdomen that is new, acute  Fever of 100F or higher  For urgent or emergent issues, a gastroenterologist can be reached at any hour by calling [phone]. Do not use MyChart messaging for urgent concerns.    DIET:  We do recommend a small meal at first, but then you may proceed  to your regular diet.  Drink plenty of fluids but you should avoid alcoholic beverages for 24 hours.  ACTIVITY:  You should plan to take it easy for the rest of today and you should NOT DRIVE or use heavy machinery until tomorrow (because of the sedation medicines used during the test).    FOLLOW UP: Our staff will call the number listed on your records the next business day following your procedure.  We will call around 7:15- 8:00 am to check on you and address any questions or concerns that you may have regarding the information given to you following your procedure. If we do not reach you, we will leave a message.     If any biopsies were taken you will be contacted by phone or by letter within the next 1-3 weeks.  Please call us at [phone] if you have not heard about the biopsies in 3 weeks.    SIGNATURES/CONFIDENTIALITY: You and/or your care partner have signed paperwork which will be entered into your electronic medical record.  These signatures attest to the fact that that the information above on your After Visit Summary has been reviewed and is understood.  Full responsibility of the confidentiality of this discharge information lies with you and/or your care-partner. 

## 2023-04-11 NOTE — Progress Notes (Signed)
Called to room to assist during endoscopic procedure.  Patient ID and intended procedure confirmed with present staff. Received instructions for my participation in the procedure from the performing physician.  

## 2023-04-11 NOTE — Progress Notes (Signed)
Pt's states no medical or surgical changes since previsit or office visit. 

## 2023-04-11 NOTE — Op Note (Signed)
[facility] Patient Name: Michael Shepard Procedure Date: 04/11/2023 10:07 AM MRN: [id number] Endoscopist: Beverley Fiedler , MD, [id number] Age: 67 Referring MD:  Date of Birth: 07/23/1956 Gender: Male Account #: [id number] Procedure:                Colonoscopy Indications:              High risk colon cancer surveillance: Personal                            history of non-advanced adenomas, Last colonoscopy:                            March 2019 (2 adenomas) Medicines:                Monitored Anesthesia Care Procedure:                Pre-Anesthesia Assessment:                           - Prior to the procedure, a History and Physical                            was performed, and patient medications and                            allergies were reviewed. The patient's tolerance of                            previous anesthesia was also reviewed. The risks                            and benefits of the procedure and the sedation                            options and risks were discussed with the patient.                            All questions were answered, and informed consent                            was obtained. Prior Anticoagulants: The patient has                            taken no anticoagulant or antiplatelet agents. ASA                            Grade Assessment: II - A patient with mild systemic                            disease. After reviewing the risks and benefits,                            the patient was deemed in satisfactory condition to  undergo the procedure.                           After obtaining informed consent, the colonoscope                            was passed under direct vision. Throughout the                            procedure, the patient's blood pressure, pulse, and                            oxygen saturations were monitored continuously. The                            CF [id number] #[id number] was introduced  through the anus                            and advanced to the cecum, identified by                            appendiceal orifice and ileocecal valve. The                            colonoscopy was performed without difficulty. The                            patient tolerated the procedure well. The quality                            of the bowel preparation was good. The ileocecal                            valve, appendiceal orifice, and rectum were                            photographed. Scope In: 10:19:05 AM Scope Out: 10:35:17 AM Scope Withdrawal Time: 0 hours 13 minutes 9 seconds  Total Procedure Duration: 0 hours 16 minutes 12 seconds  Findings:                 The digital rectal exam was normal.                           Three sessile polyps were found in the ascending                            colon. The polyps were 5 to 10 mm in size. These                            polyps were removed with a cold snare. Resection                            and retrieval were complete.  Two sessile polyps were found in the descending                            colon. The polyps were 3 to 7 mm in size. These                            polyps were removed with a cold snare. Resection                            and retrieval were complete.                           Multiple large-mouthed, medium-mouthed and                            small-mouthed diverticula were found in the sigmoid                            colon and descending colon.                           Internal hemorrhoids were found during                            retroflexion. The hemorrhoids were small. Complications:            No immediate complications. Estimated Blood Loss:     Estimated blood loss was minimal. Impression:               - Three 5 to 10 mm polyps in the ascending colon,                            removed with a cold snare. Resected and retrieved.                           - Two 3  to 7 mm polyps in the descending colon,                            removed with a cold snare. Resected and retrieved.                           - Moderate diverticulosis in the sigmoid colon and                            in the descending colon.                           - Small internal hemorrhoids. Recommendation:           - Patient has a contact number available for                            emergencies. The signs and symptoms of potential  delayed complications were discussed with the                            patient. Return to normal activities tomorrow.                            Written discharge instructions were provided to the                            patient.                           - Resume previous diet.                           - Continue present medications.                           - Await pathology results.                           - Repeat colonoscopy is recommended for                            surveillance. The colonoscopy date will be                            determined after pathology results from today's                            exam become available for review. Beverley Fiedler, MD 04/11/2023 10:39:38 AM This report has been signed electronically.

## 2023-04-11 NOTE — Progress Notes (Signed)
GASTROENTEROLOGY PROCEDURE H&P NOTE   Primary Care Physician: Myrlene Broker, MD    Reason for Procedure:  History of adenomatous colon polyps  Plan:    Colonoscopy  Patient is appropriate for endoscopic procedure(s) in the ambulatory ([facility]) setting.  The nature of the procedure, as well as the risks, benefits, and alternatives were carefully and thoroughly reviewed with the patient. Ample time for discussion and questions allowed. The patient understood, was satisfied, and agreed to proceed.     HPI: SHAHAB Shepard is a 67 y.o. male who presents for surveillance colonoscopy.  Medical history as below.  Tolerated the prep.  No recent chest pain or shortness of breath.  No abdominal pain today.  Past Medical History:  Diagnosis Date   Allergy    Cerebral AVM    Esophageal reflux    Generalized anxiety disorder    Other and unspecified hyperlipidemia    Sleep apnea    Unspecified essential hypertension     Past Surgical History:  Procedure Laterality Date   COLONOSCOPY  11-22-04   HEMORRHOID SURGERY  1998   WISDOM TOOTH EXTRACTION      Prior to Admission medications   Medication Sig Start Date End Date Taking? Authorizing Provider  alfuzosin (UROXATRAL) 10 MG 24 hr tablet Take 10 mg by mouth daily. 10/06/20  Yes [provider]  finasteride (PROSCAR) 5 MG tablet Take 5 mg by mouth daily. 11/03/21  Yes [provider]  omeprazole (PRILOSEC) 40 MG capsule Take 1 capsule (40 mg total) by mouth daily. 01/07/23  Yes Myrlene Broker, MD  OVER THE COUNTER MEDICATION Turmeric, 1000 mg one tablet daily.   Yes [provider]  OVER THE COUNTER MEDICATION Feverfew, 380 mg one tablet twice daily.   Yes [provider]  simvastatin (ZOCOR) 40 MG tablet Take 1 tablet (40 mg total) by mouth at bedtime. 01/07/23  Yes Myrlene Broker, MD  valsartan-hydrochlorothiazide (DIOVAN-HCT) 80-12.5 MG tablet Take 1 tablet by mouth daily. 01/07/23   Yes Myrlene Broker, MD    Current Outpatient Medications  Medication Sig Dispense Refill   alfuzosin (UROXATRAL) 10 MG 24 hr tablet Take 10 mg by mouth daily.     finasteride (PROSCAR) 5 MG tablet Take 5 mg by mouth daily.     omeprazole (PRILOSEC) 40 MG capsule Take 1 capsule (40 mg total) by mouth daily. 90 capsule 3   OVER THE COUNTER MEDICATION Turmeric, 1000 mg one tablet daily.     OVER THE COUNTER MEDICATION Feverfew, 380 mg one tablet twice daily.     simvastatin (ZOCOR) 40 MG tablet Take 1 tablet (40 mg total) by mouth at bedtime. 90 tablet 3   valsartan-hydrochlorothiazide (DIOVAN-HCT) 80-12.5 MG tablet Take 1 tablet by mouth daily. 90 tablet 3   Current Facility-Administered Medications  Medication Dose Route Frequency Provider Last Rate Last Admin   0.9 %  sodium chloride infusion  500 mL Intravenous Once [place], Carie Caddy, MD        Allergies as of 04/11/2023 - Review Complete 04/11/2023  Allergen Reaction Noted   Sulfa antibiotics Other (See Comments) 04/17/2019   Erythromycin Nausea And Vomiting    Penicillins Hives and Swelling     Family History  Problem Relation Age of Onset   Ovarian cancer Mother    Migraines Mother    Diabetes Father    Coronary artery disease Maternal Grandfather    Diabetes Other    Colon cancer Neg Hx  Esophageal cancer Neg Hx    Liver cancer Neg Hx    Pancreatic cancer Neg Hx    Rectal cancer Neg Hx    Stomach cancer Neg Hx     Social History   Socioeconomic History   Marital status: Married    Spouse name: Not on file   Number of children: 0   Years of education: 16   Highest education level: Not on file  Occupational History   Occupation: pulishing    Employer: [employer]  Tobacco Use   Smoking status: Former    Current packs/day: 0.00    Types: Cigarettes    Quit date: 10/03/2005    Years since quitting: 17.5   Smokeless tobacco: Never  Substance and Sexual Activity   Alcohol use: Yes    Alcohol/week:  7.0 standard drinks of alcohol    Types: 7 Standard drinks or equivalent per week    Comment: DRINKS WINE OR MIXED DRINK 3-4 TIMES PER WEEK PER PT.   Drug use: No   Sexual activity: Yes    Partners: Female  Other Topics Concern   Not on file  Social History Narrative   HSG, [employer] - BA. Married '86 - '11/divorced. He is in a monogamous relationship (June '13). 1 daughter - stepchild '80. 2 grandchildren but given up for adoption.  work: Systems developer for [employer]. Hobbies: hiking and outdoor activity. Reviewed: ACP - raised the issue.    Social Determinants of Health   Financial Resource Strain: Not on file  Food Insecurity: Not on file  Transportation Needs: Not on file  Physical Activity: Not on file  Stress: Not on file  Social Connections: Not on file  Intimate Partner Violence: Not on file    Physical Exam: Vital signs in last 24 hours: @BP  135/80   Pulse 76   Temp (!) 97.5 F (36.4 C) (Temporal)   Ht 5\' 10"  (1.778 m)   Wt 182 lb (82.6 kg)   SpO2 98%   BMI 26.11 kg/m  GEN: NAD EYE: Sclerae anicteric ENT: MMM CV: Non-tachycardic Pulm: CTA b/l GI: Soft, NT/ND NEURO:  Alert & Oriented x 3   Erick Blinks, MD [facility]  04/11/2023 10:07 AM

## 2023-04-12 ENCOUNTER — Telehealth: Payer: Self-pay | Admitting: *Deleted

## 2023-04-12 NOTE — Telephone Encounter (Signed)
  Follow up Call-     04/11/2023    9:30 AM  Call back number  Post procedure Call Back phone  # [phone]  Permission to leave phone message Yes     Patient questions:  Do you have a fever, pain , or abdominal swelling? No. Pain Score  0 *  Have you tolerated food without any problems? Yes.    Have you been able to return to your normal activities? Yes.    Do you have any questions about your discharge instructions: Diet   No. Medications  No. Follow up visit  No.  Do you have questions or concerns about your Care? No.  Actions: * If pain score is 4 or above: No action needed, pain <4.

## 2023-04-15 ENCOUNTER — Encounter: Payer: Self-pay | Admitting: Internal Medicine

## 2023-08-29 ENCOUNTER — Encounter: Payer: Self-pay | Admitting: Internal Medicine

## 2023-11-19 ENCOUNTER — Encounter: Payer: Self-pay | Admitting: Internal Medicine

## 2024-01-15 ENCOUNTER — Encounter: Payer: Self-pay | Admitting: Internal Medicine

## 2024-01-15 ENCOUNTER — Ambulatory Visit: Admitting: Internal Medicine

## 2024-01-15 VITALS — BP 118/80 | HR 78 | Temp 98.1°F | Ht 70.0 in | Wt 191.0 lb

## 2024-01-15 DIAGNOSIS — R7303 Prediabetes: Secondary | ICD-10-CM

## 2024-01-15 DIAGNOSIS — G4733 Obstructive sleep apnea (adult) (pediatric): Secondary | ICD-10-CM

## 2024-01-15 DIAGNOSIS — Z0000 Encounter for general adult medical examination without abnormal findings: Secondary | ICD-10-CM | POA: Diagnosis not present

## 2024-01-15 DIAGNOSIS — I10 Essential (primary) hypertension: Secondary | ICD-10-CM | POA: Diagnosis not present

## 2024-01-15 DIAGNOSIS — K219 Gastro-esophageal reflux disease without esophagitis: Secondary | ICD-10-CM

## 2024-01-15 DIAGNOSIS — E785 Hyperlipidemia, unspecified: Secondary | ICD-10-CM | POA: Diagnosis not present

## 2024-01-15 DIAGNOSIS — E782 Mixed hyperlipidemia: Secondary | ICD-10-CM

## 2024-01-15 LAB — LIPID PANEL
Cholesterol: 208 mg/dL — ABNORMAL HIGH (ref 0–200)
HDL: 68.5 mg/dL (ref >39.00)
LDL Cholesterol: 118 mg/dL — ABNORMAL HIGH (ref 0–99)
NonHDL: 139.24
Total CHOL/HDL Ratio: 3
Triglycerides: 104 mg/dL (ref 0.0–149.0)
VLDL: 20.8 mg/dL (ref 0.0–40.0)

## 2024-01-15 LAB — COMPREHENSIVE METABOLIC PANEL WITH GFR
ALT: 30 U/L (ref 0–53)
AST: 22 U/L (ref 0–37)
Albumin: 4.6 g/dL (ref 3.5–5.2)
Alkaline Phosphatase: 52 U/L (ref 39–117)
BUN: 17 mg/dL (ref 6–23)
CO2: 29 meq/L (ref 19–32)
Calcium: 9.5 mg/dL (ref 8.4–10.5)
Chloride: 102 meq/L (ref 96–112)
Creatinine, Ser: 0.98 mg/dL (ref 0.40–1.50)
GFR: 80.62 mL/min (ref >60.00)
Glucose, Bld: 104 mg/dL — ABNORMAL HIGH (ref 70–99)
Potassium: 4.1 meq/L (ref 3.5–5.1)
Sodium: 139 meq/L (ref 135–145)
Total Bilirubin: 0.5 mg/dL (ref 0.2–1.2)
Total Protein: 7.1 g/dL (ref 6.0–8.3)

## 2024-01-15 LAB — CBC
HCT: 41.8 % (ref 39.0–52.0)
Hemoglobin: 14.2 g/dL (ref 13.0–17.0)
MCHC: 34 g/dL (ref 30.0–36.0)
MCV: 92.3 fl (ref 78.0–100.0)
Platelets: 250 10*3/uL (ref 150.0–400.0)
RBC: 4.52 Mil/uL (ref 4.22–5.81)
RDW: 13.4 % (ref 11.5–15.5)
WBC: 5.2 10*3/uL (ref 4.0–10.5)

## 2024-01-15 LAB — HEMOGLOBIN A1C: Hgb A1c MFr Bld: 6 % (ref 4.6–6.5)

## 2024-01-15 MED ORDER — SIMVASTATIN 40 MG PO TABS
40.0000 mg | ORAL_TABLET | Freq: Every day | ORAL | 3 refills | Status: AC
Start: 2024-01-15 — End: ?

## 2024-01-15 MED ORDER — VALSARTAN-HYDROCHLOROTHIAZIDE 80-12.5 MG PO TABS: 1.0000 | ORAL_TABLET | Freq: Every day | ORAL | 3 refills | Status: AC

## 2024-01-15 MED ORDER — OMEPRAZOLE 40 MG PO CPDR: 40.0000 mg | DELAYED_RELEASE_CAPSULE | Freq: Every day | ORAL | 3 refills | Status: AC

## 2024-01-15 NOTE — Progress Notes (Unsigned)
   Subjective:   Patient ID: Michael Shepard, male    DOB: [birth year], 68 y.o.   MRN: [id number]  HPI The patient is here for physical.  PMH, [facility], social history reviewed and updated  Review of Systems  Constitutional: Negative.   HENT: Negative.    Eyes: Negative.   Respiratory:  Negative for cough, chest tightness and shortness of breath.   Cardiovascular:  Negative for chest pain, palpitations and leg swelling.  Gastrointestinal:  Negative for abdominal distention, abdominal pain, constipation, diarrhea, nausea and vomiting.  Musculoskeletal: Negative.   Skin: Negative.   Neurological: Negative.   Psychiatric/Behavioral: Negative.      Objective:  Physical Exam Constitutional:      Appearance: He is well-developed.  HENT:     Head: Normocephalic and atraumatic.  Cardiovascular:     Rate and Rhythm: Normal rate and regular rhythm.  Pulmonary:     Effort: Pulmonary effort is normal. No respiratory distress.     Breath sounds: Normal breath sounds. No wheezing or rales.  Abdominal:     General: Bowel sounds are normal. There is no distension.     Palpations: Abdomen is soft.     Tenderness: There is no abdominal tenderness. There is no rebound.  Musculoskeletal:     Cervical back: Normal range of motion.  Skin:    General: Skin is warm and dry.  Neurological:     Mental Status: He is alert and oriented to person, place, and time.     Coordination: Coordination normal.     Vitals:   01/15/24 1059  BP: 118/80  Pulse: 78  Temp: 98.1 F (36.7 C)  TempSrc: Oral  SpO2: 97%  Weight: 191 lb (86.6 kg)  Height: 5\' 10"  (1.778 m)    Assessment & Plan:

## 2024-01-15 NOTE — Progress Notes (Unsigned)
 Subjective:    Michael Shepard is a 68 y.o. male who presents for an initial Medicare exam.   Cardiac Risk Factors include: advanced age (>74men, >89 women)     Objective:    Today's Vitals   01/15/24 1059  BP: 118/80  Pulse: 78  Temp: 98.1 F (36.7 C)  TempSrc: Oral  SpO2: 97%  Weight: 191 lb (86.6 kg)  Height: 5\' 10"  (1.778 m)   Body mass index is 27.41 kg/m.  Medications Outpatient Encounter Medications as of 01/15/2024  Medication Sig   alfuzosin (UROXATRAL) 10 MG 24 hr tablet Take 10 mg by mouth daily.   finasteride (PROSCAR) 5 MG tablet Take 5 mg by mouth daily.   OVER THE COUNTER MEDICATION Turmeric, 1000 mg one tablet daily.   OVER THE COUNTER MEDICATION Feverfew, 380 mg one tablet twice daily.   [DISCONTINUED] omeprazole  (PRILOSEC) 40 MG capsule Take 1 capsule (40 mg total) by mouth daily.   [DISCONTINUED] simvastatin  (ZOCOR ) 40 MG tablet Take 1 tablet (40 mg total) by mouth at bedtime.   [DISCONTINUED] valsartan -hydrochlorothiazide  (DIOVAN -HCT) 80-12.5 MG tablet Take 1 tablet by mouth daily.   omeprazole  (PRILOSEC) 40 MG capsule Take 1 capsule (40 mg total) by mouth daily.   simvastatin  (ZOCOR ) 40 MG tablet Take 1 tablet (40 mg total) by mouth at bedtime.   valsartan -hydrochlorothiazide  (DIOVAN -HCT) 80-12.5 MG tablet Take 1 tablet by mouth daily.   No facility-administered encounter medications on file as of 01/15/2024.     History: Past Medical History:  Diagnosis Date   Allergy    Cerebral AVM    Esophageal reflux    Generalized anxiety disorder    Other and unspecified hyperlipidemia    Sleep apnea    Unspecified essential hypertension    Past Surgical History:  Procedure Laterality Date   COLONOSCOPY  11-22-04   HEMORRHOID SURGERY  1998   WISDOM TOOTH EXTRACTION      Family History  Problem Relation Age of Onset   Ovarian cancer Mother    Migraines Mother    Diabetes Father    Coronary artery disease Maternal Grandfather    Diabetes Other     Colon cancer Neg Hx    Esophageal cancer Neg Hx    Liver cancer Neg Hx    Pancreatic cancer Neg Hx    Rectal cancer Neg Hx    Stomach cancer Neg Hx    Social History   Occupational History   Occupation: Manufacturing engineer: [employer]  Tobacco Use   Smoking status: Former    Current packs/day: 0.00    Types: Cigarettes    Quit date: 10/03/2005    Years since quitting: 18.3   Smokeless tobacco: Never  Substance and Sexual Activity   Alcohol use: Yes    Alcohol/week: 7.0 standard drinks of alcohol    Types: 7 Standard drinks or equivalent per week    Comment: DRINKS WINE OR MIXED DRINK 3-4 TIMES PER WEEK PER PT.   Drug use: No   Sexual activity: Yes    Partners: Female    Tobacco Counseling Counseling given: Yes   Immunizations and Health Maintenance Immunization History  Administered Date(s) Administered   Influenza Whole 07/08/2009, 06/05/2010   Influenza,inj,Quad PF,6+ Mos 07/18/2016, 06/18/2019, 05/02/2020, 05/19/2022   Influenza-Unspecified 06/25/2014, 06/16/2015, 05/24/2018   PFIZER Comirnaty(Gray Top)Covid-19 Tri-Sucrose Vaccine 12/28/2020   PFIZER(Purple Top)SARS-COV-2 Vaccination 11/18/2019, 12/09/2019, 05/11/2020   PNEUMOCOCCAL CONJUGATE-20 01/07/2023   Pfizer Covid-19 Vaccine Bivalent Booster 104yrs & up 05/04/2021,  05/19/2022   Tdap 02/20/2018   Zoster Recombinant(Shingrix ) 04/17/2019, 06/18/2019   Health Maintenance Due  Topic Date Due   COVID-19 Vaccine (7 - 2024-25 season) 05/05/2023    Activities of Daily Living    01/15/2024   11:29 AM  In your present state of health, do you have any difficulty performing the following activities:  Hearing? 0  Vision? 0  Difficulty concentrating or making decisions? 0  Walking or climbing stairs? 0  Dressing or bathing? 0  Doing errands, shopping? 0  Preparing Food and eating ? N  Using the Toilet? N  In the past six months, have you accidently leaked urine? N  Do you have problems with loss of  bowel control? N  Managing your Medications? N  Managing your Finances? N  Housekeeping or managing your Housekeeping? N    Physical Exam  See separate note for CPE   Advanced Directives: Does Patient Have a Medical Advance Directive?: Yes Type of Advance Directive: Healthcare Power of Attorney, Living will Does patient want to make changes to medical advance directive?: No - Patient declined Copy of Healthcare Power of Attorney in Chart?: No - copy requested  EKG:  declines     Assessment:    This is a routine wellness  examination for this patient .   Vision/Hearing screen Vision Screening   Right eye Left eye Both eyes  Without correction 20/15 20/15 20/20   With correction     Hearing Screening - Comments:: Normal to whisper bilaterally, tinnitus present   Goals      DIET - EAT MORE FRUITS AND VEGETABLES         Depression Screen    01/15/2024   11:09 AM 01/07/2023   10:04 AM 03/13/2022    3:17 PM 12/07/2021    9:03 AM  PHQ 2/9 Scores  PHQ - 2 Score 0 0 0 0  PHQ- 9 Score  1 1      Fall Risk    01/15/2024   11:25 AM  Fall Risk   Falls in the past year? 0    Cognitive Function        01/15/2024   11:28 AM  6CIT Screen  What Year? 0 points  What month? 0 points  What time? 0 points  Count back from 20 0 points  Months in reverse 0 points  Repeat phrase 0 points  Total Score 0 points    Patient Care Team: Adelia Homestead, MD as PCP - General (Internal Medicine)     Plan:     I have personally reviewed and noted the following in the patient's chart:   Medical and social history Use of alcohol, tobacco or illicit drugs  Current medications and supplements including opioid prescriptions. Patient is not currently taking opioid prescriptions. Functional ability and status Nutritional status Physical activity Advanced directives List of other physicians Hospitalizations, surgeries, and ER visits in previous 12  months Vitals Screenings to include cognitive, depression, and falls Referrals and appointments  In addition, I have reviewed and discussed with patient certain preventive protocols, quality metrics, and best practice recommendations. A written personalized care plan for preventive services as well as general preventive health recommendations were provided to patient.    Adelia Homestead, MD 01/17/2024

## 2024-01-15 NOTE — Patient Instructions (Signed)
  Michael Shepard , Thank you for taking time to come for your Medicare Wellness Visit. I appreciate your ongoing commitment to your health goals. Please review the following plan we discussed and let me know if I can assist you in the future.   These are the goals we discussed:  Goals      DIET - EAT MORE FRUITS AND VEGETABLES        This is a list of the screening recommended for you and due dates:  Health Maintenance  Topic Date Due   COVID-19 Vaccine (7 - 2024-25 season) 05/05/2023   Flu Shot  04/03/2024   Medicare Annual Wellness Visit  01/14/2025   Colon Cancer Screening  04/10/2026   DTaP/Tdap/Td vaccine (2 - Td or Tdap) 02/21/2028   Pneumonia Vaccine  Completed   Hepatitis C Screening  Completed   Zoster (Shingles) Vaccine  Completed   HPV Vaccine  Aged Out   Meningitis B Vaccine  Aged Out

## 2024-01-17 NOTE — Assessment & Plan Note (Signed)
 Continues using CPAP nightly with benefit. Continue.

## 2024-01-17 NOTE — Assessment & Plan Note (Signed)
Checking lipid panel and adjust simvastatin as needed.  

## 2024-01-17 NOTE — Assessment & Plan Note (Signed)
 Checking HgA1c and adjust as needed.

## 2024-01-17 NOTE — Assessment & Plan Note (Signed)
 Taking omeprazole  and controlled. Will continue.

## 2024-01-17 NOTE — Assessment & Plan Note (Signed)
Flu shot yearly. Pneumonia complete. Shingrix complete. Tetanus up to date. Colonoscopy up to date. Counseled about sun safety and mole surveillance. Counseled about the dangers of distracted driving. Given 10 year screening recommendations.

## 2024-01-17 NOTE — Assessment & Plan Note (Signed)
 BP at goal on valsartan /hydrochlorothiazide  and checking CMP and adjust as needed.

## 2024-01-20 ENCOUNTER — Ambulatory Visit: Payer: Self-pay | Admitting: Internal Medicine

## 2024-04-30 ENCOUNTER — Encounter: Payer: Self-pay | Admitting: Internal Medicine

## 2024-05-05 ENCOUNTER — Telehealth: Payer: Self-pay

## 2024-05-05 NOTE — Telephone Encounter (Signed)
 Called patient and he stated that he was told he has to have one in order to get the vaccine

## 2024-05-05 NOTE — Telephone Encounter (Signed)
 Is this a request for a vaccine rx? None is needed for covid-19 vaccine

## 2024-05-05 NOTE — Telephone Encounter (Signed)
 Copied from CRM [id number]. Topic: Clinical - Request for Lab/Test Order >> May 05, 2024  9:47 AM Vena HERO wrote: Reason for CRM: Pt is requesting a script for Covid vax sent over to [facility] [address] [place], [place] [phone] Please reach out to pt if you have any questions at [phone]

## 2024-05-06 NOTE — Telephone Encounter (Signed)
Please call pharmacy and clarify

## 2024-05-06 NOTE — Telephone Encounter (Signed)
 Ok for verbal. It will be impossible to e-prescribe this until the new vaccines are available in epic. As of now they are not available yet.

## 2024-05-06 NOTE — Telephone Encounter (Signed)
 [place]  is now requiring a script for Covid vaccine

## 2024-05-07 NOTE — Telephone Encounter (Signed)
 They are not able to take verbals over the phone they are requesting a prescription

## 2024-05-08 NOTE — Telephone Encounter (Signed)
 We are not able to e-prescribe at this time may be able to do a paper copy if he wants to come get

## 2024-05-12 ENCOUNTER — Telehealth: Payer: Self-pay | Admitting: Radiology

## 2024-05-12 MED ORDER — COVID-19 MRNA VACC (MODERNA) 50 MCG/0.5ML IM SUSP: 0.5000 mL | Freq: Once | INTRAMUSCULAR | 0 refills | Status: AC

## 2024-05-12 NOTE — Telephone Encounter (Signed)
 Copied from CRM [id number]. Topic: General - Other >> May 11, 2024  4:53 PM Wess RAMAN wrote: Reason for CRM: Patient would like to receive the covid shot and the pharmacy stated he needs the prescription sent to the pharmacy.  Callback #: [phone]  Phamacy: [id number] - [place], [place] - [address] AT [address] [place] [address] Phone: [phone] Fax: [phone] Hours: Not open 24 hours

## 2024-05-12 NOTE — Addendum Note (Signed)
 Addended by: ROLLENE NORRIS A on: 05/12/2024 03:01 PM   Modules accepted: Orders

## 2024-05-12 NOTE — Telephone Encounter (Signed)
 Sent in

## 2024-08-13 ENCOUNTER — Ambulatory Visit: Payer: Self-pay

## 2024-08-13 NOTE — Telephone Encounter (Signed)
 FYI Only or Action Required?: FYI only for provider: appointment scheduled on 08/14/2024 at 3:00 PM.  Patient was last seen in primary care on 01/15/2024 by Rollene Almarie LABOR, MD.  Called Nurse Triage reporting Abdominal Pain.  Symptoms began several days ago.  Interventions attempted: Rest, hydration, or home remedies.  Symptoms are: unchanged.  Triage Disposition: See Physician Within 24 Hours  Patient/caregiver understands and will follow disposition?: Yes  Copied from CRM #[id number]. Topic: Clinical - Red Word Triage >> Aug 13, 2024 10:27 AM Thersia BROCKS wrote: [facility] that prompted transfer to Nurse Triage: Patient called in stated he is having Lower abdominal pain , starts out slow then gets really severe pain , wants to get that checked out Reason for Disposition  [1] MODERATE pain (e.g., interferes with normal activities) AND [2] pain comes and goes (cramps) AND [3] present > 24 hours  (Exception: Pain with Vomiting or Diarrhea - see that Guideline.)  Answer Assessment - Initial Assessment Questions 1. LOCATION: Where does it hurt?      Left lower abdominal pain 2. RADIATION: Does the pain shoot anywhere else? (e.g., chest, back)     No radiation 3. ONSET: When did the pain begin? (Minutes, hours or days ago)      Started on Wednesday 4. SUDDEN: Gradual or sudden onset?     sudden 5. PATTERN Does the pain come and go, or is it constant?     Comes and goes 6. SEVERITY: How bad is the pain?  (e.g., Scale 1-10; mild, moderate, or severe)     2 out of 10 7. RECURRENT SYMPTOM: Have you ever had this type of stomach pain before? If Yes, ask: When was the last time? and What happened that time?      no 8. CAUSE: What do you think is causing the stomach pain? (e.g., gallstones, recent abdominal surgery)     Concerned for possible diverticulitis 9. RELIEVING/AGGRAVATING FACTORS: What makes it better or worse? (e.g., antacids, bending or twisting motion, bowel  movement)     Having after a bowel movement-pain is better.  10. OTHER SYMPTOMS: Do you have any other symptoms? (e.g., back pain, diarrhea, fever, urination pain, vomiting)       no  Protocols used: Abdominal Pain - Male-A-AH

## 2024-08-13 NOTE — Progress Notes (Signed)
 "  Acute Office Visit  Subjective:     Patient ID: Michael Shepard, male    DOB: 1955-11-04, 68 y.o.   MRN: [id number]  Chief Complaint  Patient presents with   Acute Visit    C/o LT side stabbing abdominal pain woke up Tuesday night with jabbing pain has subsided since then to feeling like gas  Took ibuprofen Wednesday for it      HPI  Discussed the use of AI scribe software for clinical note transcription with the patient, who gave verbal consent to proceed.  History of Present Illness Michael Shepard is a 68 year old male with diverticulosis who presents with abdominal pain.  Abdominal pain - Sudden onset of sharp, stabbing abdominal pain began Tuesday night, awakening him from sleep - Pain initially severe, now mild and primarily provoked by certain movements or pressure - Describes sensation as sharp abdominal pressure - Ibuprofen provided partial relief - No prior episodes of similar pain or previous diverticulitis flare  Bowel habits - Increased constipation two days prior to pain onset, resolved by late Tuesday - Frequent diarrhea attributed to medications, without recent change in frequency or character - No recent change in diet or fluid intake - No unusual diarrhea reported - No change in stool color  Associated symptoms - No nausea, vomiting, fever, or chills  Medical history: diverticulosis - Diverticulosis diagnosed on colonoscopy years ago     ROS Per HPI      Objective:    BP (!) 114/56 (BP Location: Left Arm, Patient Position: Sitting)   Pulse 89   Temp 98.7 F (37.1 C) (Temporal)   Ht 5' 10 (1.778 m)   Wt 174 lb 3.2 oz (79 kg)   SpO2 96%   BMI 25.00 kg/m    Physical Exam Vitals and nursing note reviewed.  Constitutional:      General: He is not in acute distress.    Appearance: Normal appearance.  HENT:     Head: Normocephalic and atraumatic.     Right Ear: External ear normal.     Left Ear: External ear normal.     Nose:  Nose normal.     Mouth/Throat:     Mouth: Mucous membranes are moist.     Pharynx: Oropharynx is clear.  Eyes:     Extraocular Movements: Extraocular movements intact.  Cardiovascular:     Rate and Rhythm: Normal rate and regular rhythm.  Pulmonary:     Effort: Pulmonary effort is normal.  Abdominal:     General: Bowel sounds are normal. There is no distension.     Palpations: Abdomen is soft. There is no mass.     Tenderness: There is abdominal tenderness. There is no guarding or rebound.     Hernia: No hernia is present.     Comments: TTP to LLQ  Musculoskeletal:        General: Normal range of motion.     Cervical back: Normal range of motion.     Right lower leg: No edema.     Left lower leg: No edema.  Lymphadenopathy:     Cervical: No cervical adenopathy.  Skin:    General: Skin is warm and dry.  Neurological:     General: No focal deficit present.     Mental Status: He is alert and oriented to person, place, and time.  Psychiatric:        Mood and Affect: Mood normal.  Behavior: Behavior normal.     Results for orders placed or performed in visit on 08/14/24  CBC w/Diff  Result Value Ref Range   WBC 6.8 4.0 - 10.5 K/uL   RBC 4.11 (L) 4.22 - 5.81 Mil/uL   Hemoglobin 12.2 (L) 13.0 - 17.0 g/dL   HCT 64.4 (L) 60.9 - 47.9 %   MCV 86.3 78.0 - 100.0 fl   MCHC 34.2 30.0 - 36.0 g/dL   RDW 87.6 88.4 - 84.4 %   Platelets 277.0 150.0 - 400.0 K/uL   Neutrophils Relative % 67.4 43.0 - 77.0 %   Lymphocytes Relative 22.2 12.0 - 46.0 %   Monocytes Relative 8.6 3.0 - 12.0 %   Eosinophils Relative 1.6 0.0 - 5.0 %   Basophils Relative 0.2 0.0 - 3.0 %   Neutro Abs 4.6 1.4 - 7.7 K/uL   Lymphs Abs 1.5 0.7 - 4.0 K/uL   Monocytes Absolute 0.6 0.1 - 1.0 K/uL   Eosinophils Absolute 0.1 0.0 - 0.7 K/uL   Basophils Absolute 0.0 0.0 - 0.1 K/uL  Comp Met (CMET)  Result Value Ref Range   Sodium 140 135 - 145 mEq/L   Potassium 3.6 3.5 - 5.1 mEq/L   Chloride 101 96 - 112 mEq/L    CO2 29 19 - 32 mEq/L   Glucose, Bld 137 (H) 70 - 99 mg/dL   BUN 15 6 - 23 mg/dL   Creatinine, Ser 9.34 0.40 - 1.50 mg/dL   Total Bilirubin 0.3 0.2 - 1.2 mg/dL   Alkaline Phosphatase 63 39 - 117 U/L   AST 13 0 - 37 U/L   ALT 17 0 - 53 U/L   Total Protein 7.0 6.0 - 8.3 g/dL   Albumin 4.0 3.5 - 5.2 g/dL   GFR 01.87 >39.99 mL/min   Calcium 9.4 8.4 - 10.5 mg/dL        Assessment & Plan:   Assessment and Plan Assessment & Plan Left lower quadrant pain with constipation Intermittent sharp pain in left lower quadrant, improving. Likely constipation or gas. Diverticulitis and kidney stones less likely. - Ordered abdominal x-ray to rule out stones. - Ordered labs for WBC, iron, and electrolytes. - Advised to report if symptoms persist for further imaging.     Orders Placed This Encounter  Procedures   DG Abd 1 View    Standing Status:   Future    Number of Occurrences:   1    Expiration Date:   08/14/2025    Reason for Exam (SYMPTOM  OR DIAGNOSIS REQUIRED):   abdominal pain    Preferred imaging location?:   [facility]   CBC w/Diff   Comp Met (CMET)     No orders of the defined types were placed in this encounter.   Return if symptoms worsen or fail to improve.  Corean LITTIE Ku, FNP  "

## 2024-08-14 ENCOUNTER — Ambulatory Visit

## 2024-08-14 ENCOUNTER — Ambulatory Visit: Admitting: Family Medicine

## 2024-08-14 ENCOUNTER — Encounter: Payer: Self-pay | Admitting: Family Medicine

## 2024-08-14 VITALS — BP 114/56 | HR 89 | Temp 98.7°F | Ht 70.0 in | Wt 174.2 lb

## 2024-08-14 DIAGNOSIS — K5900 Constipation, unspecified: Secondary | ICD-10-CM

## 2024-08-14 DIAGNOSIS — R1032 Left lower quadrant pain: Secondary | ICD-10-CM

## 2024-08-14 LAB — CBC WITH DIFFERENTIAL/PLATELET
Basophils Absolute: 0 K/uL (ref 0.0–0.1)
Basophils Relative: 0.2 % (ref 0.0–3.0)
Eosinophils Absolute: 0.1 K/uL (ref 0.0–0.7)
Eosinophils Relative: 1.6 % (ref 0.0–5.0)
HCT: 35.5 % — ABNORMAL LOW (ref 39.0–52.0)
Hemoglobin: 12.2 g/dL — ABNORMAL LOW (ref 13.0–17.0)
Lymphocytes Relative: 22.2 % (ref 12.0–46.0)
Lymphs Abs: 1.5 K/uL (ref 0.7–4.0)
MCHC: 34.2 g/dL (ref 30.0–36.0)
MCV: 86.3 fl (ref 78.0–100.0)
Monocytes Absolute: 0.6 K/uL (ref 0.1–1.0)
Monocytes Relative: 8.6 % (ref 3.0–12.0)
Neutro Abs: 4.6 K/uL (ref 1.4–7.7)
Neutrophils Relative %: 67.4 % (ref 43.0–77.0)
Platelets: 277 K/uL (ref 150.0–400.0)
RBC: 4.11 Mil/uL — ABNORMAL LOW (ref 4.22–5.81)
RDW: 12.3 % (ref 11.5–15.5)
WBC: 6.8 K/uL (ref 4.0–10.5)

## 2024-08-14 LAB — COMPREHENSIVE METABOLIC PANEL WITH GFR
ALT: 17 U/L (ref 0–53)
AST: 13 U/L (ref 0–37)
Albumin: 4 g/dL (ref 3.5–5.2)
Alkaline Phosphatase: 63 U/L (ref 39–117)
BUN: 15 mg/dL (ref 6–23)
CO2: 29 meq/L (ref 19–32)
Calcium: 9.4 mg/dL (ref 8.4–10.5)
Chloride: 101 meq/L (ref 96–112)
Creatinine, Ser: 0.65 mg/dL (ref 0.40–1.50)
GFR: 98.12 mL/min (ref >60.00)
Glucose, Bld: 137 mg/dL — ABNORMAL HIGH (ref 70–99)
Potassium: 3.6 meq/L (ref 3.5–5.1)
Sodium: 140 meq/L (ref 135–145)
Total Bilirubin: 0.3 mg/dL (ref 0.2–1.2)
Total Protein: 7 g/dL (ref 6.0–8.3)

## 2024-08-14 NOTE — Patient Instructions (Addendum)
 We are getting an xray today. We will be in contact with any abnormal results that require further attention.  We are checking labs today, will be in contact with any results that require further attention  Follow-up with me for new or worsening symptoms.

## 2024-08-19 ENCOUNTER — Ambulatory Visit: Payer: Self-pay | Admitting: Family Medicine
